# Patient Record
Sex: Female | Born: 1970 | Race: Black or African American | Hispanic: No | Marital: Single | Smoking: Never smoker
Health system: Southern US, Community
[De-identification: ages and names within clinical notes are randomized; demographics above are authoritative.]

## PROBLEM LIST (undated history)

## (undated) DIAGNOSIS — R5382 Chronic fatigue, unspecified: Secondary | ICD-10-CM

## (undated) DIAGNOSIS — Z87442 Personal history of urinary calculi: Secondary | ICD-10-CM

## (undated) DIAGNOSIS — R112 Nausea with vomiting, unspecified: Secondary | ICD-10-CM

## (undated) DIAGNOSIS — M797 Fibromyalgia: Secondary | ICD-10-CM

## (undated) DIAGNOSIS — R519 Headache, unspecified: Secondary | ICD-10-CM

## (undated) DIAGNOSIS — G5603 Carpal tunnel syndrome, bilateral upper limbs: Secondary | ICD-10-CM

## (undated) DIAGNOSIS — R200 Anesthesia of skin: Secondary | ICD-10-CM

## (undated) DIAGNOSIS — F410 Panic disorder [episodic paroxysmal anxiety] without agoraphobia: Secondary | ICD-10-CM

## (undated) DIAGNOSIS — D649 Anemia, unspecified: Secondary | ICD-10-CM

## (undated) DIAGNOSIS — M1990 Unspecified osteoarthritis, unspecified site: Secondary | ICD-10-CM

## (undated) DIAGNOSIS — R202 Paresthesia of skin: Secondary | ICD-10-CM

## (undated) DIAGNOSIS — Z9889 Other specified postprocedural states: Secondary | ICD-10-CM

## (undated) DIAGNOSIS — K219 Gastro-esophageal reflux disease without esophagitis: Secondary | ICD-10-CM

## (undated) DIAGNOSIS — R42 Dizziness and giddiness: Secondary | ICD-10-CM

## (undated) DIAGNOSIS — F32A Depression, unspecified: Secondary | ICD-10-CM

## (undated) DIAGNOSIS — F4310 Post-traumatic stress disorder, unspecified: Secondary | ICD-10-CM

## (undated) DIAGNOSIS — IMO0001 Reserved for inherently not codable concepts without codable children: Secondary | ICD-10-CM

## (undated) DIAGNOSIS — I959 Hypotension, unspecified: Secondary | ICD-10-CM

## (undated) DIAGNOSIS — R51 Headache: Secondary | ICD-10-CM

## (undated) HISTORY — PX: FOOT SURGERY: SHX648

---

## 2009-01-21 ENCOUNTER — Emergency Department (HOSPITAL_COMMUNITY): Admission: EM | Admit: 2009-01-21 | Discharge: 2009-01-21 | Payer: Self-pay | Admitting: *Deleted

## 2009-08-31 ENCOUNTER — Emergency Department (HOSPITAL_COMMUNITY): Admission: EM | Admit: 2009-08-31 | Discharge: 2009-08-31 | Payer: Self-pay | Admitting: Emergency Medicine

## 2010-11-23 ENCOUNTER — Emergency Department (HOSPITAL_COMMUNITY): Admission: EM | Admit: 2010-11-23 | Discharge: 2010-01-25 | Payer: Self-pay | Admitting: Emergency Medicine

## 2011-01-10 ENCOUNTER — Emergency Department (HOSPITAL_COMMUNITY)
Admission: EM | Admit: 2011-01-10 | Discharge: 2011-01-10 | Payer: Self-pay | Source: Home / Self Care | Admitting: Emergency Medicine

## 2011-01-10 LAB — URINALYSIS, ROUTINE W REFLEX MICROSCOPIC
Bilirubin Urine: NEGATIVE
Ketones, ur: NEGATIVE mg/dL
Nitrite: NEGATIVE
Protein, ur: NEGATIVE mg/dL
Specific Gravity, Urine: 1.006 (ref 1.005–1.030)
Urine Glucose, Fasting: NEGATIVE mg/dL
Urobilinogen, UA: 0.2 mg/dL (ref 0.0–1.0)
pH: 7.5 (ref 5.0–8.0)

## 2011-01-10 LAB — DIFFERENTIAL
Basophils Absolute: 0 K/uL (ref 0.0–0.1)
Basophils Relative: 0 % (ref 0–1)
Eosinophils Absolute: 0 K/uL (ref 0.0–0.7)
Eosinophils Relative: 1 % (ref 0–5)
Lymphocytes Relative: 49 % — ABNORMAL HIGH (ref 12–46)
Lymphs Abs: 1.7 K/uL (ref 0.7–4.0)
Monocytes Absolute: 0.3 K/uL (ref 0.1–1.0)
Monocytes Relative: 9 % (ref 3–12)
Neutro Abs: 1.5 K/uL — ABNORMAL LOW (ref 1.7–7.7)
Neutrophils Relative %: 42 % — ABNORMAL LOW (ref 43–77)

## 2011-01-10 LAB — CBC
HCT: 34.9 % — ABNORMAL LOW (ref 36.0–46.0)
Hemoglobin: 11.3 g/dL — ABNORMAL LOW (ref 12.0–15.0)
MCH: 30.1 pg (ref 26.0–34.0)
MCHC: 32.4 g/dL (ref 30.0–36.0)
MCV: 92.8 fL (ref 78.0–100.0)
Platelets: 274 K/uL (ref 150–400)
RBC: 3.76 MIL/uL — ABNORMAL LOW (ref 3.87–5.11)
RDW: 13 % (ref 11.5–15.5)
WBC: 3.5 K/uL — ABNORMAL LOW (ref 4.0–10.5)

## 2011-01-10 LAB — COMPREHENSIVE METABOLIC PANEL
BUN: 7 mg/dL (ref 6–23)
Creatinine, Ser: 0.84 mg/dL (ref 0.4–1.2)
Potassium: 4 mEq/L (ref 3.5–5.1)
Sodium: 141 mEq/L (ref 135–145)

## 2011-01-10 LAB — URINE MICROSCOPIC-ADD ON

## 2011-01-10 LAB — D-DIMER, QUANTITATIVE

## 2011-01-10 LAB — POCT PREGNANCY, URINE: Preg Test, Ur: NEGATIVE

## 2011-01-22 ENCOUNTER — Emergency Department (HOSPITAL_COMMUNITY): Payer: Medicaid Other

## 2011-01-22 ENCOUNTER — Emergency Department (HOSPITAL_COMMUNITY)
Admission: EM | Admit: 2011-01-22 | Discharge: 2011-01-22 | Disposition: A | Discharge status: Home / Self Care | Payer: Medicaid Other | Attending: Emergency Medicine | Admitting: Emergency Medicine

## 2011-01-22 DIAGNOSIS — K219 Gastro-esophageal reflux disease without esophagitis: Secondary | ICD-10-CM | POA: Insufficient documentation

## 2011-01-22 DIAGNOSIS — N898 Other specified noninflammatory disorders of vagina: Secondary | ICD-10-CM | POA: Insufficient documentation

## 2011-01-22 DIAGNOSIS — R109 Unspecified abdominal pain: Secondary | ICD-10-CM | POA: Insufficient documentation

## 2011-01-22 DIAGNOSIS — N949 Unspecified condition associated with female genital organs and menstrual cycle: Secondary | ICD-10-CM | POA: Insufficient documentation

## 2011-01-22 DIAGNOSIS — R10819 Abdominal tenderness, unspecified site: Secondary | ICD-10-CM | POA: Insufficient documentation

## 2011-01-22 DIAGNOSIS — IMO0001 Reserved for inherently not codable concepts without codable children: Secondary | ICD-10-CM | POA: Insufficient documentation

## 2011-01-22 DIAGNOSIS — M549 Dorsalgia, unspecified: Secondary | ICD-10-CM | POA: Insufficient documentation

## 2011-01-22 LAB — URINE MICROSCOPIC-ADD ON

## 2011-01-22 LAB — CBC
Hemoglobin: 11.3 g/dL — ABNORMAL LOW (ref 12.0–15.0)
MCV: 91.9 fL (ref 78.0–100.0)
WBC: 4.4 10*3/uL (ref 4.0–10.5)

## 2011-01-22 LAB — WET PREP, GENITAL
Clue Cells Wet Prep HPF POC: NONE SEEN
Yeast Wet Prep HPF POC: NONE SEEN

## 2011-01-22 LAB — COMPREHENSIVE METABOLIC PANEL
Albumin: 3.6 g/dL (ref 3.5–5.2)
CO2: 26 mEq/L (ref 19–32)
Chloride: 105 mEq/L (ref 96–112)
Creatinine, Ser: 0.88 mg/dL (ref 0.4–1.2)
GFR calc non Af Amer: >60 mL/min (ref >60)
Glucose, Bld: 85 mg/dL (ref 70–99)
Sodium: 138 mEq/L (ref 135–145)
Total Bilirubin: 0.7 mg/dL (ref 0.3–1.2)

## 2011-01-22 LAB — URINALYSIS, ROUTINE W REFLEX MICROSCOPIC
Ketones, ur: NEGATIVE mg/dL
Nitrite: NEGATIVE
Specific Gravity, Urine: 1.018 (ref 1.005–1.030)
pH: 6.5 (ref 5.0–8.0)

## 2011-01-22 LAB — DIFFERENTIAL
Basophils Absolute: 0 10*3/uL (ref 0.0–0.1)
Basophils Relative: 0 % (ref 0–1)
Eosinophils Absolute: 0 10*3/uL (ref 0.0–0.7)
Lymphs Abs: 1.8 10*3/uL (ref 0.7–4.0)
Monocytes Relative: 10 % (ref 3–12)

## 2011-01-22 LAB — PREGNANCY, URINE: Preg Test, Ur: NEGATIVE

## 2011-03-07 LAB — COMPREHENSIVE METABOLIC PANEL
Alkaline Phosphatase: 43 U/L (ref 39–117)
BUN: 10 mg/dL (ref 6–23)
CO2: 26 mEq/L (ref 19–32)
Creatinine, Ser: 0.86 mg/dL (ref 0.4–1.2)
GFR calc Af Amer: >60 mL/min (ref >60)
GFR calc non Af Amer: >60 mL/min (ref >60)
Glucose, Bld: 98 mg/dL (ref 70–99)
Potassium: 3.9 mEq/L (ref 3.5–5.1)
Sodium: 132 mEq/L — ABNORMAL LOW (ref 135–145)

## 2011-03-07 LAB — URINALYSIS, ROUTINE W REFLEX MICROSCOPIC
Bilirubin Urine: NEGATIVE
Glucose, UA: NEGATIVE mg/dL
Ketones, ur: NEGATIVE mg/dL
Leukocytes, UA: NEGATIVE
Protein, ur: NEGATIVE mg/dL
Urobilinogen, UA: 1 mg/dL (ref 0.0–1.0)

## 2011-03-07 LAB — CBC
RDW: 12.5 % (ref 11.5–15.5)
WBC: 4.9 10*3/uL (ref 4.0–10.5)

## 2011-03-07 LAB — POCT CARDIAC MARKERS
Myoglobin, poc: 25 ng/mL (ref 12–200)
Troponin i, poc: <0.05 ng/mL (ref 0.00–0.09)

## 2011-03-07 LAB — URINE MICROSCOPIC-ADD ON

## 2011-03-07 LAB — DIFFERENTIAL
Basophils Absolute: 0 10*3/uL (ref 0.0–0.1)
Eosinophils Relative: 2 % (ref 0–5)
Lymphocytes Relative: 53 % — ABNORMAL HIGH (ref 12–46)
Lymphs Abs: 2.6 10*3/uL (ref 0.7–4.0)
Monocytes Relative: 7 % (ref 3–12)
Neutrophils Relative %: 38 % — ABNORMAL LOW (ref 43–77)

## 2011-03-23 LAB — URINALYSIS, ROUTINE W REFLEX MICROSCOPIC
Leukocytes, UA: NEGATIVE
Nitrite: NEGATIVE
Protein, ur: NEGATIVE mg/dL
pH: 5.5 (ref 5.0–8.0)

## 2011-03-23 LAB — CBC
Hemoglobin: 12.1 g/dL (ref 12.0–15.0)
MCHC: 33.9 g/dL (ref 30.0–36.0)
MCV: 93.3 fL (ref 78.0–100.0)
RBC: 3.83 MIL/uL — ABNORMAL LOW (ref 3.87–5.11)

## 2011-03-23 LAB — POCT I-STAT, CHEM 8
BUN: 10 mg/dL (ref 6–23)
Calcium, Ion: 1.03 mmol/L — ABNORMAL LOW (ref 1.12–1.32)
Creatinine, Ser: 0.8 mg/dL (ref 0.4–1.2)
Glucose, Bld: 86 mg/dL (ref 70–99)
TCO2: 24 mmol/L (ref 0–100)

## 2011-03-23 LAB — DIFFERENTIAL
Basophils Relative: 0 % (ref 0–1)
Eosinophils Absolute: 0 10*3/uL (ref 0.0–0.7)
Lymphs Abs: 1.6 10*3/uL (ref 0.7–4.0)
Monocytes Absolute: 0.3 10*3/uL (ref 0.1–1.0)
Neutro Abs: 1.7 10*3/uL (ref 1.7–7.7)

## 2011-03-23 LAB — URINE MICROSCOPIC-ADD ON

## 2011-09-12 ENCOUNTER — Emergency Department (INDEPENDENT_AMBULATORY_CARE_PROVIDER_SITE_OTHER): Payer: Medicaid Other

## 2011-09-12 ENCOUNTER — Emergency Department (HOSPITAL_BASED_OUTPATIENT_CLINIC_OR_DEPARTMENT_OTHER)
Admission: EM | Admit: 2011-09-12 | Discharge: 2011-09-13 | Disposition: A | Discharge status: Home / Self Care | Payer: Medicaid Other | Attending: Emergency Medicine | Admitting: Emergency Medicine

## 2011-09-12 ENCOUNTER — Encounter: Payer: Self-pay | Admitting: *Deleted

## 2011-09-12 DIAGNOSIS — X500XXA Overexertion from strenuous movement or load, initial encounter: Secondary | ICD-10-CM

## 2011-09-12 DIAGNOSIS — R209 Unspecified disturbances of skin sensation: Secondary | ICD-10-CM

## 2011-09-12 DIAGNOSIS — IMO0002 Reserved for concepts with insufficient information to code with codable children: Secondary | ICD-10-CM | POA: Insufficient documentation

## 2011-09-12 DIAGNOSIS — IMO0001 Reserved for inherently not codable concepts without codable children: Secondary | ICD-10-CM | POA: Insufficient documentation

## 2011-09-12 DIAGNOSIS — Z8739 Personal history of other diseases of the musculoskeletal system and connective tissue: Secondary | ICD-10-CM | POA: Insufficient documentation

## 2011-09-12 DIAGNOSIS — S8391XA Sprain of unspecified site of right knee, initial encounter: Secondary | ICD-10-CM

## 2011-09-12 DIAGNOSIS — M25569 Pain in unspecified knee: Secondary | ICD-10-CM

## 2011-09-12 HISTORY — DX: Fibromyalgia: M79.7

## 2011-09-12 HISTORY — DX: Anemia, unspecified: D64.9

## 2011-09-12 HISTORY — DX: Unspecified osteoarthritis, unspecified site: M19.90

## 2011-09-12 MED ORDER — OXYCODONE-ACETAMINOPHEN 5-325 MG PO TABS
1.0000 | ORAL_TABLET | Freq: Once | ORAL | Status: AC
  Administered 2011-09-12: 1 via ORAL
  Filled 2011-09-12: qty 1

## 2011-09-12 MED ORDER — IBUPROFEN 400 MG PO TABS
400.0000 mg | ORAL_TABLET | Freq: Once | ORAL | Status: AC
  Administered 2011-09-12: 400 mg via ORAL
  Filled 2011-09-12: qty 1

## 2011-09-12 NOTE — ED Notes (Signed)
Has had an URI the past few days. Now right knee pain and swelling.

## 2011-09-12 NOTE — ED Provider Notes (Signed)
History     CSN: [id number] Arrival date & time: 09/12/2011 10:42 PM  Chief Complaint  Patient presents with  . Knee Pain    (Consider location/radiation/quality/duration/timing/severity/associated sxs/prior treatment) HPI Comments: Patient presents here to the emergency department due to increasing pain in her right knee. She denies any specific falls. Patient reports that she has had some gradual worsening right knee pain throughout the day which he attributed to poor footwear. She reports that she was walking on level ground and does recall turning or twisting possibly on her knee and subsequently had sharp pain during the day and she reports the knee gave way and she actually fell to the ground. Denies any significant injuries from the fall. She has been able to bear weight but not for a well. She reports pain when she tries to flex her knee greater than 30. She reports the pain radiates down the back of her knee along her lower leg all the way to her ankle. She denies any numbness or weakness to her right leg. Patient also reports for the past 5 days she's had some myalgias, coughing, fatigue and weakness. She does have a significant history of fibromyalgia. She was seen by her primary care doctor for her other symptoms but did not have any pain at the time. She was supposed to have blood tests and a followup with her primary care physician. She reports no significant swelling to her lower extremity, chest pain, pleuritic pain subsequent to her lower leg injury. She denies any recent long distance travel.  Patient is a 40 y.o. female presenting with knee pain. The history is provided by the patient.  Knee Pain Associated symptoms include chest pain and shortness of breath. Pertinent negatives include no headaches.    Past Medical History  Diagnosis Date  . Fibromyalgia   . Arthritis   . Anemia     Past Surgical History  Procedure Date  . Cesarean section   . Foot surgery     No  family history on file.  History  Substance Use Topics  . Smoking status: Never Smoker   . Smokeless tobacco: Not on file  . Alcohol Use: No    OB History    Grav Para Term Preterm Abortions TAB SAB Ect Mult Living                  Review of Systems  Constitutional: Positive for chills. Negative for fever, activity change and appetite change.  Respiratory: Positive for cough and shortness of breath. Negative for chest tightness and wheezing.   Cardiovascular: Positive for chest pain. Negative for leg swelling.  Musculoskeletal: Positive for myalgias and arthralgias. Negative for back pain, joint swelling and gait problem.  Neurological: Negative for syncope and headaches.    Allergies  Other  Home Medications   Current Outpatient Rx  Name Route Sig Dispense Refill  . NAPROXEN SODIUM 220 MG PO TABS Oral Take 440 mg by mouth 2 (two) times daily with a meal.      . COLD AND FLU PO Oral Take 2 tablets by mouth once.        BP 108/69  Pulse 75  Temp(Src) 99.6 F (37.6 C) (Oral)  Resp 20  SpO2 100%  Physical Exam  Constitutional: She appears well-developed and well-nourished. She appears distressed.  Cardiovascular: Normal rate and regular rhythm.   Pulmonary/Chest: Effort normal and breath sounds normal.  Musculoskeletal:       Legs:  Normal distal strength to RLE, gross sensation is intact, DP is 2+  Neurological: She has normal strength. No sensory deficit.  Skin: Skin is warm and dry. No abrasion, no bruising, no laceration, no lesion, no petechiae and no rash noted.    ED Course  Procedures (including critical care time)  Labs Reviewed - No data to display No results found.   No diagnosis found.    MDM  No risks for DVT or PE.  Pt likely injured it when she twisted.  I favor a meniscal injury or other mild ligamentous strain or possible rupture.  Will get plain films, place in knee immobilizer, crutches.  Pt and family advise about RICE therapy and  follow up with sports physician or orthopedist.        12:19 AM I reviewed plain films of right knee and is negative for fractures    Gavin Pound. Oletta Lamas, MD 09/13/11 1610

## 2011-09-12 NOTE — ED Notes (Signed)
Pt. Has positive pedal pulses bilat. And is in no resp. Distress.

## 2011-09-12 NOTE — ED Notes (Signed)
Pt. Reports she has had flu like symptoms and has seen her primary MD for this today.  Pt. Skin is warm and dry.

## 2011-09-13 ENCOUNTER — Encounter (HOSPITAL_BASED_OUTPATIENT_CLINIC_OR_DEPARTMENT_OTHER): Payer: Self-pay | Admitting: Emergency Medicine

## 2011-09-13 MED ORDER — OXYCODONE-ACETAMINOPHEN 5-325 MG PO TABS: ORAL_TABLET | ORAL | Status: DC

## 2011-09-13 NOTE — Discharge Instructions (Signed)
Narcotic and benzodiazepine use may cause drowsiness, slowed breathing or dependence.  Please use with caution and do not drive, operate machinery or watch young children alone while taking them.  Taking combinations of these medications or drinking alcohol will potentiate these effects.    

## 2011-09-13 NOTE — ED Notes (Signed)
Pt. Walks on crutches with no trouble

## 2011-09-17 ENCOUNTER — Encounter: Payer: Self-pay | Admitting: Family Medicine

## 2011-09-17 ENCOUNTER — Ambulatory Visit (INDEPENDENT_AMBULATORY_CARE_PROVIDER_SITE_OTHER): Payer: Medicaid Other | Admitting: Family Medicine

## 2011-09-17 VITALS — BP 98/68 | HR 71 | Temp 98.1°F | Ht 66.0 in | Wt 160.0 lb

## 2011-09-17 DIAGNOSIS — M25561 Pain in right knee: Secondary | ICD-10-CM

## 2011-09-17 DIAGNOSIS — S99929A Unspecified injury of unspecified foot, initial encounter: Secondary | ICD-10-CM

## 2011-09-17 DIAGNOSIS — M25569 Pain in unspecified knee: Secondary | ICD-10-CM

## 2011-09-17 DIAGNOSIS — S8991XA Unspecified injury of right lower leg, initial encounter: Secondary | ICD-10-CM

## 2011-09-17 MED ORDER — ETODOLAC 400 MG PO TABS: 400.0000 mg | ORAL_TABLET | Freq: Two times a day (BID) | ORAL | Status: AC

## 2011-09-17 NOTE — Progress Notes (Signed)
  Subjective:    Patient ID: Stefanie Wade, female    DOB: Sep 10, 1971, 40 y.o.   MRN: [id number]  PCP: Dr. August Luz  HPI 40 yo F here for right knee injury  Patient reports on 9/26 she was standing in the kitchen, turned to go to the left and felt her right knee almost give out with medial knee pain. Reports she could barely move and knee was very swollen. Went to ED and was given immobilizer with crutches, percocet after x-rays were negative. No prior right knee injuries or surgeries. Not taking percocet or motrin now. Etodolac helps - was on this before for other issues. No true locking and hasn't given out, just feels unstable.  Past Medical History  Diagnosis Date  . Fibromyalgia   . Arthritis   . Anemia     Current Outpatient Prescriptions on File Prior to Visit  Medication Sig Dispense Refill  . Nutritional Supplements (COLD AND FLU PO) Take 2 tablets by mouth once.          Past Surgical History  Procedure Date  . Cesarean section   . Foot surgery     Allergies  Allergen Reactions  . Other     Tylenol #3 becomes jittery when taking  . Percocet (Oxycodone-Acetaminophen)     Upsets stomach    History   Social History  . Marital Status: Divorced    Spouse Name: N/A    Number of Children: N/A  . Years of Education: N/A   Occupational History  . Not on file.   Social History Main Topics  . Smoking status: Never Smoker   . Smokeless tobacco: Not on file  . Alcohol Use: No  . Drug Use: Not on file  . Sexually Active: Not on file   Other Topics Concern  . Not on file   Social History Narrative  . No narrative on file    Family History  Problem Relation Age of Onset  . Diabetes Mother   . Hypertension Mother   . Diabetes Sister   . Diabetes Maternal Grandmother   . Hypertension Maternal Grandmother   . Diabetes Paternal Grandmother   . Hypertension Paternal Grandmother   . Heart attack Neg Hx   . Hyperlipidemia Neg Hx   . Sudden death Neg Hx      BP 98/68  Pulse 71  Temp(Src) 98.1 F (36.7 C) (Oral)  Ht 5\' 6"  (1.676 m)  Wt 160 lb (72.576 kg)  BMI 25.82 kg/m2  Review of Systems See HPI above.    Objective:   Physical Exam Gen: NAD  R knee: No gross deformity, ecchymoses, swelling. Mod medial joint line TTP.  Mild lateral joint line TTP.  Diffuse posterior and anterior tenderness, mild. FROM. Negative ant/post drawers. Negative valgus/varus testing. Negative lachmanns. Pain anteriorly with mcmurrays and apleys.  Negative patellar apprehension. NV intact distally.    Assessment & Plan:  1. Right knee injury - X-rays negative.  Ligaments intact.  Most tenderness is at medial joint line though exam shows tenderness throughout entire knee.  Most likely 2/2 meniscal contusion though meniscal tear is possible.  Start PT, icing, etodolac.  Could consider cortisone injection as well.  If not improving after 5-6 weeks of conservative care, would proceed with MRI to further assess.

## 2011-09-17 NOTE — Patient Instructions (Signed)
Your history and exam are suggestive of either a meniscus contusion (bruise) or a small meniscal tear. Both are treated conservatively initially. Take etodolac daily with food. Ice knee 15 minutes at a time 3-4 times a day. Don't use the immobilizer. Ok to use crutches if you need to. Start physical therapy to focus on quad strengthening and transition to a home exercise program. Follow up with me in 5 weeks when you are 6 weeks out from your injury. If not improving at that time, we will move forward with an MRI of your knee.

## 2011-09-17 NOTE — Assessment & Plan Note (Signed)
X-rays negative.  Ligaments intact.  Most tenderness is at medial joint line though exam shows tenderness throughout entire knee.  Most likely 2/2 meniscal contusion though meniscal tear is possible.  Start PT, icing, etodolac.  Could consider cortisone injection as well.  If not improving after 5-6 weeks of conservative care, would proceed with MRI to further assess.

## 2011-09-26 ENCOUNTER — Ambulatory Visit: Payer: Medicaid Other | Attending: Family Medicine | Admitting: Physical Therapy

## 2011-10-22 ENCOUNTER — Ambulatory Visit (INDEPENDENT_AMBULATORY_CARE_PROVIDER_SITE_OTHER): Payer: Medicaid Other | Admitting: Family Medicine

## 2011-10-22 ENCOUNTER — Encounter: Payer: Self-pay | Admitting: Family Medicine

## 2011-10-22 VITALS — BP 99/66 | HR 81 | Temp 98.6°F | Ht 66.0 in | Wt 155.0 lb

## 2011-10-22 DIAGNOSIS — S8991XA Unspecified injury of right lower leg, initial encounter: Secondary | ICD-10-CM

## 2011-10-22 DIAGNOSIS — S99929A Unspecified injury of unspecified foot, initial encounter: Secondary | ICD-10-CM

## 2011-10-22 NOTE — Patient Instructions (Signed)
Start physical therapy for at minimum 1 visit then transition to a home exercise program and do this daily. Take etodolac twice a day with food. Ice knee 15 minutes at a time 3-4 times a day as needed for pain. Ok to consider ACE wrap of the knee for stability but don't use the immobilizer. Follow up with me in 6 weeks for a recheck. If not improving at that time, we will move forward with an MRI of your knee. If at any time over the next 6 weeks you want to go ahead with a cortisone injection we could do this as well.

## 2011-10-22 NOTE — Assessment & Plan Note (Signed)
X-rays negative.  Concerning for possible meniscal tear though diffuse tenderness and no effusion may be nonanatomic as well.  Will try conservative care.  She has not done PT or home exercise program - checked with her insurance and copay and she would have to pay minimal amount and would be covered for 3 visits - she stated she will do this.  Declined an intraarticular cortisone injection today.  Continue icing, etodolac (though advised to take bid regularly).  F/u in 6 weeks for reevaluation.

## 2011-10-22 NOTE — Progress Notes (Signed)
Subjective:    Patient ID: Stefanie Wade, female    DOB: 08-10-1971, 40 y.o.   MRN: [id number]  PCP: Dr. August Luz  HPI  40 yo F here for f/u right knee injury  10/1: Patient reports on 9/26 she was standing in the kitchen, turned to go to the left and felt her right knee almost give out with medial knee pain. Reports she could barely move and knee was very swollen. Went to ED and was given immobilizer with crutches, percocet after x-rays were negative. No prior right knee injuries or surgeries. Not taking percocet or motrin now. Etodolac helps - was on this before for other issues. No true locking and hasn't given out, just feels unstable.  11/5: Patient reports she did not go to physical therapy because she thought she would have to pay a lot of money for visits. Has been taking etodolac occasionally. Not using crutches and has stopped using her immobilizer. No new swelling or bruising. No new injury. Reports pain is anterior mostly but has started to get pain into calf, across medial knee and up posterior thigh. Has history of low back pain regularly that preceded her injury. No new numbness/tingling. States knee feels like it's giving out.  No locking.  Past Medical History  Diagnosis Date  . Fibromyalgia   . Arthritis   . Anemia     Current Outpatient Prescriptions on File Prior to Visit  Medication Sig Dispense Refill  . etodolac (LODINE) 400 MG tablet Take 1 tablet (400 mg total) by mouth 2 (two) times daily.  60 tablet  1  . Nutritional Supplements (COLD AND FLU PO) Take 2 tablets by mouth once.          Past Surgical History  Procedure Date  . Cesarean section   . Foot surgery     Allergies  Allergen Reactions  . Other     Tylenol #3 becomes jittery when taking  . Percocet (Oxycodone-Acetaminophen)     Upsets stomach    History   Social History  . Marital Status: Divorced    Spouse Name: N/A    Number of Children: N/A  . Years of Education: N/A     Occupational History  . Not on file.   Social History Main Topics  . Smoking status: Never Smoker   . Smokeless tobacco: Not on file  . Alcohol Use: No  . Drug Use: Not on file  . Sexually Active: Not on file   Other Topics Concern  . Not on file   Social History Narrative  . No narrative on file    Family History  Problem Relation Age of Onset  . Diabetes Mother   . Hypertension Mother   . Diabetes Sister   . Diabetes Maternal Grandmother   . Hypertension Maternal Grandmother   . Diabetes Paternal Grandmother   . Hypertension Paternal Grandmother   . Heart attack Neg Hx   . Hyperlipidemia Neg Hx   . Sudden death Neg Hx     BP 99/66  Pulse 81  Temp(Src) 98.6 F (37 C) (Oral)  Ht 5\' 6"  (1.676 m)  Wt 155 lb (70.308 kg)  BMI 25.02 kg/m2  Review of Systems  See HPI above.    Objective:   Physical Exam  Gen: NAD  R knee: No gross deformity, ecchymoses, swelling.  No palpable cords. Mod medial joint line TTP.  Mild lateral joint line TTP.  Diffuse posterior and anterior tenderness, mild - not all pain anatomic  including some pain post patellar facets, anterior patella and patellar tendon. FROM. Negative ant/post drawers. Negative valgus/varus testing. Negative lachmanns. Pain anteriorly with mcmurrays and apleys.  Negative patellar apprehension. NV intact distally.    Assessment & Plan:  1. Right knee injury - X-rays negative.  Concerning for possible meniscal tear though diffuse tenderness and no effusion may be nonanatomic as well.  Will try conservative care.  She has not done PT or home exercise program - checked with her insurance and copay and she would have to pay minimal amount and would be covered for 3 visits - she stated she will do this.  Declined an intraarticular cortisone injection today.  Continue icing, etodolac (though advised to take bid regularly).  F/u in 6 weeks for reevaluation.

## 2011-10-24 ENCOUNTER — Ambulatory Visit: Payer: Medicaid Other | Attending: Family Medicine | Admitting: Physical Therapy

## 2011-10-24 DIAGNOSIS — IMO0001 Reserved for inherently not codable concepts without codable children: Secondary | ICD-10-CM | POA: Insufficient documentation

## 2011-10-24 DIAGNOSIS — M25669 Stiffness of unspecified knee, not elsewhere classified: Secondary | ICD-10-CM | POA: Insufficient documentation

## 2011-10-24 DIAGNOSIS — M25569 Pain in unspecified knee: Secondary | ICD-10-CM | POA: Insufficient documentation

## 2011-10-29 ENCOUNTER — Ambulatory Visit: Payer: Medicaid Other | Admitting: Physical Therapy

## 2011-10-31 MED ORDER — TRAMADOL HCL 50 MG PO TABS: 50.0000 mg | ORAL_TABLET | Freq: Three times a day (TID) | ORAL | Status: AC | PRN

## 2011-10-31 NOTE — Progress Notes (Signed)
Addended by: Lenda Kelp on: 10/31/2011 10:44 AM   Modules accepted: Orders

## 2011-11-12 ENCOUNTER — Encounter: Payer: Medicaid Other | Admitting: Physical Therapy

## 2011-11-14 ENCOUNTER — Ambulatory Visit: Payer: Medicaid Other | Admitting: Physical Therapy

## 2011-11-19 ENCOUNTER — Ambulatory Visit: Payer: Medicaid Other | Attending: Family Medicine | Admitting: Physical Therapy

## 2011-11-19 DIAGNOSIS — M25569 Pain in unspecified knee: Secondary | ICD-10-CM | POA: Insufficient documentation

## 2011-11-19 DIAGNOSIS — IMO0001 Reserved for inherently not codable concepts without codable children: Secondary | ICD-10-CM | POA: Insufficient documentation

## 2011-11-19 DIAGNOSIS — M25669 Stiffness of unspecified knee, not elsewhere classified: Secondary | ICD-10-CM | POA: Insufficient documentation

## 2011-12-03 ENCOUNTER — Ambulatory Visit: Payer: Medicaid Other | Admitting: Family Medicine

## 2011-12-12 ENCOUNTER — Ambulatory Visit: Payer: Medicaid Other | Admitting: Family Medicine

## 2012-06-22 ENCOUNTER — Other Ambulatory Visit: Payer: Self-pay

## 2012-06-22 ENCOUNTER — Encounter (HOSPITAL_BASED_OUTPATIENT_CLINIC_OR_DEPARTMENT_OTHER): Payer: Self-pay | Admitting: *Deleted

## 2012-06-22 ENCOUNTER — Emergency Department (HOSPITAL_BASED_OUTPATIENT_CLINIC_OR_DEPARTMENT_OTHER): Payer: Medicaid Other

## 2012-06-22 ENCOUNTER — Emergency Department (HOSPITAL_BASED_OUTPATIENT_CLINIC_OR_DEPARTMENT_OTHER)
Admission: EM | Admit: 2012-06-22 | Discharge: 2012-06-22 | Disposition: A | Discharge status: Home / Self Care | Payer: Medicaid Other | Attending: Emergency Medicine | Admitting: Emergency Medicine

## 2012-06-22 DIAGNOSIS — R5381 Other malaise: Secondary | ICD-10-CM | POA: Insufficient documentation

## 2012-06-22 DIAGNOSIS — M549 Dorsalgia, unspecified: Secondary | ICD-10-CM | POA: Insufficient documentation

## 2012-06-22 DIAGNOSIS — R110 Nausea: Secondary | ICD-10-CM | POA: Insufficient documentation

## 2012-06-22 DIAGNOSIS — R5383 Other fatigue: Secondary | ICD-10-CM | POA: Insufficient documentation

## 2012-06-22 DIAGNOSIS — Z79899 Other long term (current) drug therapy: Secondary | ICD-10-CM | POA: Insufficient documentation

## 2012-06-22 DIAGNOSIS — M79605 Pain in left leg: Secondary | ICD-10-CM

## 2012-06-22 DIAGNOSIS — R079 Chest pain, unspecified: Secondary | ICD-10-CM | POA: Insufficient documentation

## 2012-06-22 DIAGNOSIS — R109 Unspecified abdominal pain: Secondary | ICD-10-CM | POA: Insufficient documentation

## 2012-06-22 DIAGNOSIS — R42 Dizziness and giddiness: Secondary | ICD-10-CM | POA: Insufficient documentation

## 2012-06-22 DIAGNOSIS — M79609 Pain in unspecified limb: Secondary | ICD-10-CM | POA: Insufficient documentation

## 2012-06-22 DIAGNOSIS — N898 Other specified noninflammatory disorders of vagina: Secondary | ICD-10-CM | POA: Insufficient documentation

## 2012-06-22 LAB — COMPREHENSIVE METABOLIC PANEL
ALT: 13 U/L (ref 0–35)
AST: 20 U/L (ref 0–37)
CO2: 25 mEq/L (ref 19–32)
Chloride: 102 mEq/L (ref 96–112)
Creatinine, Ser: 1 mg/dL (ref 0.50–1.10)
GFR calc non Af Amer: 70 mL/min — ABNORMAL LOW (ref >90)
Total Bilirubin: 0.5 mg/dL (ref 0.3–1.2)

## 2012-06-22 LAB — URINALYSIS, ROUTINE W REFLEX MICROSCOPIC
Glucose, UA: NEGATIVE mg/dL
Ketones, ur: NEGATIVE mg/dL
pH: 6 (ref 5.0–8.0)

## 2012-06-22 LAB — CBC WITH DIFFERENTIAL/PLATELET
Basophils Absolute: 0 10*3/uL (ref 0.0–0.1)
Basophils Relative: 0 % (ref 0–1)
Eosinophils Absolute: 0 10*3/uL (ref 0.0–0.7)
MCH: 30.4 pg (ref 26.0–34.0)
MCHC: 33.2 g/dL (ref 30.0–36.0)
Neutrophils Relative %: 41 % — ABNORMAL LOW (ref 43–77)
Platelets: 288 10*3/uL (ref 150–400)
RBC: 3.78 MIL/uL — ABNORMAL LOW (ref 3.87–5.11)
RDW: 12.7 % (ref 11.5–15.5)

## 2012-06-22 LAB — D-DIMER, QUANTITATIVE: D-Dimer, Quant: <0.22 ug/mL-FEU (ref 0.00–0.48)

## 2012-06-22 LAB — URINE MICROSCOPIC-ADD ON

## 2012-06-22 MED ORDER — TRAMADOL HCL 50 MG PO TABS
50.0000 mg | ORAL_TABLET | Freq: Once | ORAL | Status: AC
  Administered 2012-06-22: 50 mg via ORAL
  Filled 2012-06-22: qty 1

## 2012-06-22 MED ORDER — SODIUM CHLORIDE 0.9 % IV BOLUS (SEPSIS)
1000.0000 mL | Freq: Once | INTRAVENOUS | Status: AC
  Administered 2012-06-22: 1000 mL via INTRAVENOUS

## 2012-06-22 MED ORDER — TRAMADOL HCL 50 MG PO TABS: ORAL_TABLET | ORAL | Status: DC

## 2012-06-22 NOTE — ED Notes (Signed)
Pt states that she started having lower abd pain yesterday with nausea. Pt now c/o leg swelling, thigh pain, and some chest discomfort.

## 2012-06-22 NOTE — ED Provider Notes (Addendum)
History  This chart was scribed for Cyndra Numbers, MD by Erskine Emery. This patient was seen in room MH11/MH11 and the patient's care was started at 19:26.  CSN: [id number]  Arrival date & time 06/22/12  1913   First MD Initiated Contact with Patient 06/22/12 1926      Chief Complaint  Patient presents with  . Abdominal Pain  . Leg Pain    (Consider location/radiation/quality/duration/timing/severity/associated sxs/prior treatment) HPI  Stefanie Wade is a 41 y.o. female who presents to the Emergency Department complaining of intermittent mild chest pain lasting 1 minute at a time, described as shooting and grabbing an with associated lightheadedness, dizziness, fatigue for the past 2-3 days. Pt also reports some sharp abdominal pain, back pain, throat soreness, and a swelling and pain in her legs, described as "sticking pins". Pt denies any current nausea but states that she was nauseous yesterday. Pt describes her dizziness as the room spinning and light-headedness when she stands up. Patient states that her symptoms are worsening. Pt also denies any dysuria or hematuria, and denies any abnormal vaginal discharge, though she reports her vaginal discharge is similar to a previous episode, possibly BV that went away with medication. Pt denies taking any pain medication PTA and reports that she was able to drive herself to the ED. Pt reports she did work through the recent holiday but she has been getting plenty of rest lately. Pt reports a h/o anemia when she was little but has never had a blood transfusion. Pt reports she sometimes has issues urinating but not currently. Pt reports her baseline pain for her h/o fibromyalgia is 5/10, but denies having any problems with her fibromyalgia currently. Pt denies any h/o HTN, DM, kidney problems, liver problems, or heart issues but reports a family history of DM, MI, and thrombosis (not under the age of 18). Pt reports she was previously given anxiety  medication for irregular heart beat and panic attacks. Pt reports taking tramadol before with a reasonable response. Pt reports she typically eats a lot of shelled sunflower seeds, especially with soda to relieve nausea. Patient denies smoking.   Dr. Mikeal Hawthorne is the pt's PCP.    Past Medical History  Diagnosis Date  . Fibromyalgia   . Arthritis   . Anemia     Past Surgical History  Procedure Date  . Cesarean section   . Foot surgery     Family History  Problem Relation Age of Onset  . Diabetes Mother   . Hypertension Mother   . Diabetes Sister   . Diabetes Maternal Grandmother   . Hypertension Maternal Grandmother   . Diabetes Paternal Grandmother   . Hypertension Paternal Grandmother   . Heart attack Neg Hx   . Hyperlipidemia Neg Hx   . Sudden death Neg Hx     History  Substance Use Topics  . Smoking status: Never Smoker   . Smokeless tobacco: Not on file  . Alcohol Use: No    OB History    Grav Para Term Preterm Abortions TAB SAB Ect Mult Living                  Review of Systems  Constitutional: Positive for fatigue.  HENT: Negative.   Eyes: Negative.   Respiratory: Negative.   Cardiovascular: Positive for chest pain.  Gastrointestinal: Positive for nausea.  Genitourinary: Negative.   Musculoskeletal:       Bilateral leg pain  Skin: Negative.   Neurological: Positive for dizziness and light-headedness.  Hematological: Negative.   Psychiatric/Behavioral: Negative.   All other systems reviewed and are negative.   A complete 10 system review of systems was obtained and all systems are negative except as noted in the HPI and PMH.   Allergies  Other and Percocet  Home Medications   Current Outpatient Rx  Name Route Sig Dispense Refill  . GABAPENTIN 300 MG PO CAPS Oral Take 300 mg by mouth 3 (three) times daily. Pt states that she only takes when she feels pain    . ETODOLAC 400 MG PO TABS Oral Take 1 tablet (400 mg total) by mouth 2 (two) times  daily. 60 tablet 1  . COLD AND FLU PO Oral Take 2 tablets by mouth once.      . TRAMADOL HCL 50 MG PO TABS Oral Take 1 tablet (50 mg total) by mouth every 8 (eight) hours as needed for pain. Maximum dose= 8 tablets per day 90 tablet 0    Triage Vitals: BP 124/74  Pulse 74  Temp 98.8 F (37.1 C) (Oral)  Resp 18  Ht 5\' 6"  (1.676 m)  Wt 163 lb (73.936 kg)  BMI 26.31 kg/m2  SpO2 100%  LMP 05/31/2012  Physical Exam  Nursing note and vitals reviewed. GEN: Well-developed, well-nourished female in no distress HEENT: Atraumatic, normocephalic. Oropharynx clear without erythema EYES: PERRLA BL, no scleral icterus. NECK: Trachea midline, no meningismus CV: regular rate and rhythm. No murmurs, rubs, or gallops PULM: No respiratory distress.  No crackles, wheezes, or rales. GI: soft, non-tender. No guarding, rebound, or tenderness. + bowel sounds  GU: deferred Neuro: cranial nerves grossly 2-12 intact, no abnormalities of strength or sensation, A and O x 3, no abnormality of gait. No pronator drift, no dysmetria on finger to nose task bilaterally. MSK: Patient moves all 4 extremities symmetrically, no deformity, edema, or injury noted. Patient has tenderness to palpation of both lower extremities from the knees distally. She has absolutely no edema on my evaluation or reports that her legs are very swollen compared to previous. Both extremities are warm and well perfused. Skin: No rashes petechiae, purpura, or jaundice Psych: no abnormality of mood   ED Course  Procedures (including critical care time)  Indication: dizziness Please note this EKG was reviewed extemporaneously by myself.   Date: 06/22/2012  Rate: 72  Rhythm: normal sinus rhythm  QRS Axis: normal  Intervals: normal  ST/T Wave abnormalities: normal  Conduction Disutrbances: none  Narrative Interpretation: unremarkable        DIAGNOSTIC STUDIES: Oxygen Saturation is 100% on room air, normal by my interpretation.      COORDINATION OF CARE:  19:30--I discussed treatment plan including blood work, urinalysis, EKG, IV fluids, pain medication, chest x-ray with pt and pt agreed. I informed the pt that her neurologic exam looks good. I informed pt to follow up with her PCP for any unresolved symptoms.   19:45--Medication order: sodium chloride 0.9% bolus 1,000 mL--once  20:00--Medication order: tramadol (Ultram) tablet 50 mg--once  21:20--Rechecked pt, informed pt of negative imaging and lab results. Discussed plan for discharge and pt was agreeable.  Results for orders placed during the hospital encounter of 06/22/12  PREGNANCY, URINE      Component Value Range   Preg Test, Ur NEGATIVE  NEGATIVE  URINALYSIS, ROUTINE W REFLEX MICROSCOPIC      Component Value Range   Color, Urine YELLOW  YELLOW   APPearance CLOUDY (*) CLEAR   Specific Gravity, Urine 1.014  1.005 - 1.030  pH 6.0  5.0 - 8.0   Glucose, UA NEGATIVE  NEGATIVE mg/dL   Hgb urine dipstick MODERATE (*) NEGATIVE   Bilirubin Urine NEGATIVE  NEGATIVE   Ketones, ur NEGATIVE  NEGATIVE mg/dL   Protein, ur NEGATIVE  NEGATIVE mg/dL   Urobilinogen, UA 0.2  0.0 - 1.0 mg/dL   Nitrite NEGATIVE  NEGATIVE   Leukocytes, UA SMALL (*) NEGATIVE  COMPREHENSIVE METABOLIC PANEL      Component Value Range   Sodium 136  135 - 145 mEq/L   Potassium 4.0  3.5 - 5.1 mEq/L   Chloride 102  96 - 112 mEq/L   CO2 25  19 - 32 mEq/L   Glucose, Bld 97  70 - 99 mg/dL   BUN 14  6 - 23 mg/dL   Creatinine, Ser 6.57  0.50 - 1.10 mg/dL   Calcium 9.2  8.4 - 84.6 mg/dL   Total Protein 7.4  6.0 - 8.3 g/dL   Albumin 4.0  3.5 - 5.2 g/dL   AST 20  0 - 37 U/L   ALT 13  0 - 35 U/L   Alkaline Phosphatase 53  39 - 117 U/L   Total Bilirubin 0.5  0.3 - 1.2 mg/dL   GFR calc non Af Amer 70 (*) >90 mL/min   GFR calc Af Amer 81 (*) >90 mL/min  CBC WITH DIFFERENTIAL      Component Value Range   WBC 5.4  4.0 - 10.5 K/uL   RBC 3.78 (*) 3.87 - 5.11 MIL/uL   Hemoglobin 11.5 (*) 12.0 -  15.0 g/dL   HCT 96.2 (*) 95.2 - 84.1 %   MCV 91.5  78.0 - 100.0 fL   MCH 30.4  26.0 - 34.0 pg   MCHC 33.2  30.0 - 36.0 g/dL   RDW 32.4  40.1 - 02.7 %   Platelets 288  150 - 400 K/uL   Neutrophils Relative 41 (*) 43 - 77 %   Neutro Abs 2.2  1.7 - 7.7 K/uL   Lymphocytes Relative 49 (*) 12 - 46 %   Lymphs Abs 2.6  0.7 - 4.0 K/uL   Monocytes Relative 10  3 - 12 %   Monocytes Absolute 0.5  0.1 - 1.0 K/uL   Eosinophils Relative 1  0 - 5 %   Eosinophils Absolute 0.0  0.0 - 0.7 K/uL   Basophils Relative 0  0 - 1 %   Basophils Absolute 0.0  0.0 - 0.1 K/uL  D-DIMER, QUANTITATIVE      Component Value Range   D-Dimer, Quant <0.22  0.00 - 0.48 ug/mL-FEU  URINE MICROSCOPIC-ADD ON      Component Value Range   Squamous Epithelial / LPF MANY (*) RARE   WBC, UA 3-6  <3 WBC/hpf   RBC / HPF 3-6  <3 RBC/hpf   Bacteria, UA MANY (*) RARE     Dg Chest 2 View  06/22/2012  *RADIOLOGY REPORT*  Clinical Data: Chest pain  CHEST - 2 VIEW  Comparison: 01/10/2011  Findings: Normal heart size.  Clear lungs.  No pneumothorax or pleural effusion.  IMPRESSION: No active cardiopulmonary disease.  Original Report Authenticated By: Donavan Burnet, M.D.     1. Leg pain, bilateral   2. Chest pain   3. Light headedness       MDM  Patient was evaluated by myself. Based on evaluation she had workup for possible causes of fluid overload that she had no detectable significant edema. Patient also complained  of chest pain but has no history of hypertension, hyperlipidemia, diabetes, tobacco use, early family history of this, thromboembolic disease, or significant risk factors for thromboembolic disease. Workup was completely negative with normal EKG, chest x-ray, CBC, complete metabolic panel, d-dimer, urinalysis, urine pregnancy. On further discussion patient appeared to have more of a lightheadedness than dizziness. She had a completely normal neurologic exam. Patient was given a total of 100 mg of tramadol by mouth for  her pain here in the emergency department. She reported that she had gotten anxious when treated with oral narcotic medications previously and therefore nothing stronger was provided. Patient no history of injury. She had no skin changes to suggest a cellulitis or other cause of her symptoms. Patient was advised to followup with her primary care physician. She received a liter of normal saline IV bolus. Patient did continue to feel lightheaded at the time of discharge but preferred not to receive any more IV fluids and stated she could drink at home. Patient I discussed the possibility of CT of the head and my feeling that her symptoms likely would not correlate with finding on this given that she had more of a lightheadedness and this did appear to be related to standing. She understood that I was happy to perform this study but agreed that the risk of radiation did not seem worth the benefit of having this study performed today. Patient was discharged in good condition and can followup with her primary care physician. She was discharged with prescription for tramadol. I personally performed the services described in this documentation, which was scribed in my presence. The recorded information has been reviewed and considered. She did raise concerns that she might have autoimmune disease such as lupus as this runs in her family or multiple sclerosis we discussed that this is more frequently a diagnosis made on outpatient basis. Patient does have a PCP. She did not have any symptoms that would suggest need for emergent or any intervention today.          Cyndra Numbers, MD 06/22/12 2300  Cyndra Numbers, MD 06/22/12 [phone]

## 2012-06-22 NOTE — ED Notes (Signed)
Pt with multiple complaints reports dizziness bilat lower extremity swelling, intermittent  chest pain, lower abd pain, feels like pins and needles in legs, bilat arm pain and recent bruising, back pain. Pt denies urinary sx or fever. Upon assessment pt legs appear normal no pitting edema. Denies CP at present.

## 2012-06-26 ENCOUNTER — Emergency Department (HOSPITAL_BASED_OUTPATIENT_CLINIC_OR_DEPARTMENT_OTHER)
Admission: EM | Admit: 2012-06-26 | Discharge: 2012-06-26 | Disposition: A | Discharge status: Home / Self Care | Payer: Medicaid Other | Attending: Emergency Medicine | Admitting: Emergency Medicine

## 2012-06-26 ENCOUNTER — Encounter (HOSPITAL_BASED_OUTPATIENT_CLINIC_OR_DEPARTMENT_OTHER): Payer: Self-pay

## 2012-06-26 ENCOUNTER — Other Ambulatory Visit: Payer: Self-pay | Admitting: Internal Medicine

## 2012-06-26 ENCOUNTER — Emergency Department (HOSPITAL_BASED_OUTPATIENT_CLINIC_OR_DEPARTMENT_OTHER): Payer: Medicaid Other

## 2012-06-26 DIAGNOSIS — R110 Nausea: Secondary | ICD-10-CM | POA: Insufficient documentation

## 2012-06-26 DIAGNOSIS — R1013 Epigastric pain: Secondary | ICD-10-CM | POA: Insufficient documentation

## 2012-06-26 DIAGNOSIS — M129 Arthropathy, unspecified: Secondary | ICD-10-CM | POA: Insufficient documentation

## 2012-06-26 DIAGNOSIS — Z79899 Other long term (current) drug therapy: Secondary | ICD-10-CM | POA: Insufficient documentation

## 2012-06-26 LAB — COMPREHENSIVE METABOLIC PANEL
BUN: 8 mg/dL (ref 6–23)
CO2: 25 mEq/L (ref 19–32)
Calcium: 9 mg/dL (ref 8.4–10.5)
Creatinine, Ser: 1.1 mg/dL (ref 0.50–1.10)
GFR calc Af Amer: 72 mL/min — ABNORMAL LOW (ref >90)
GFR calc non Af Amer: 62 mL/min — ABNORMAL LOW (ref >90)
Glucose, Bld: 88 mg/dL (ref 70–99)

## 2012-06-26 LAB — URINE MICROSCOPIC-ADD ON

## 2012-06-26 LAB — URINALYSIS, ROUTINE W REFLEX MICROSCOPIC
Protein, ur: NEGATIVE mg/dL
Urobilinogen, UA: 1 mg/dL (ref 0.0–1.0)

## 2012-06-26 LAB — CBC WITH DIFFERENTIAL/PLATELET
Eosinophils Relative: 1 % (ref 0–5)
HCT: 35.3 % — ABNORMAL LOW (ref 36.0–46.0)
Lymphocytes Relative: 45 % (ref 12–46)
Lymphs Abs: 1.8 10*3/uL (ref 0.7–4.0)
MCV: 91.9 fL (ref 78.0–100.0)
Monocytes Absolute: 0.4 10*3/uL (ref 0.1–1.0)
Monocytes Relative: 11 % (ref 3–12)
RBC: 3.84 MIL/uL — ABNORMAL LOW (ref 3.87–5.11)
RDW: 12.2 % (ref 11.5–15.5)
WBC: 4 10*3/uL (ref 4.0–10.5)

## 2012-06-26 MED ORDER — PROMETHAZINE HCL 25 MG PO TABS: 25.0000 mg | ORAL_TABLET | Freq: Four times a day (QID) | ORAL | Status: DC | PRN

## 2012-06-26 MED ORDER — ONDANSETRON HCL 4 MG/2ML IJ SOLN
4.0000 mg | Freq: Once | INTRAMUSCULAR | Status: AC
  Administered 2012-06-26: 4 mg via INTRAVENOUS
  Filled 2012-06-26: qty 2

## 2012-06-26 MED ORDER — HYDROMORPHONE HCL PF 1 MG/ML IJ SOLN
1.0000 mg | Freq: Once | INTRAMUSCULAR | Status: AC
  Administered 2012-06-26: 1 mg via INTRAVENOUS
  Filled 2012-06-26: qty 1

## 2012-06-26 MED ORDER — HYDROCODONE-ACETAMINOPHEN 5-325 MG PO TABS: 1.0000 | ORAL_TABLET | Freq: Four times a day (QID) | ORAL | Status: AC | PRN

## 2012-06-26 MED ORDER — SODIUM CHLORIDE 0.9 % IV BOLUS (SEPSIS)
250.0000 mL | Freq: Once | INTRAVENOUS | Status: AC
  Administered 2012-06-26: 250 mL via INTRAVENOUS

## 2012-06-26 MED ORDER — SODIUM CHLORIDE 0.9 % IV SOLN
INTRAVENOUS | Status: DC
  Administered 2012-06-26: 16:00:00 via INTRAVENOUS

## 2012-06-26 MED ORDER — FAMOTIDINE 20 MG PO TABS: 20.0000 mg | ORAL_TABLET | Freq: Two times a day (BID) | ORAL | Status: DC

## 2012-06-26 NOTE — ED Notes (Signed)
Pt reports "right side" pain radiating to back. Seen here Monday for same.

## 2012-06-26 NOTE — ED Provider Notes (Signed)
History     CSN: [id number]  Arrival date & time 06/26/12  1345   First MD Initiated Contact with Patient 06/26/12 1506      Chief Complaint  Patient presents with  . Abdominal Pain    (Consider location/radiation/quality/duration/timing/severity/associated sxs/prior treatment) Patient is a 41 y.o. female presenting with abdominal pain. The history is provided by the patient.  Abdominal Pain The primary symptoms of the illness include abdominal pain and nausea. The primary symptoms of the illness do not include fever, fatigue, shortness of breath, vomiting, diarrhea or dysuria. The current episode started more than 2 days ago (Ongoing for 6 days.). The onset of the illness was sudden. The problem has not changed since onset. The pain came on suddenly. The abdominal pain is located in the RUQ. The abdominal pain radiates to the back. The severity of the abdominal pain is 10/10. The abdominal pain is relieved by nothing.  Symptoms associated with the illness do not include chills or back pain.   Pain is been ongoing for 6 days patient was seen on July 7 with negative labs. Pain in the right upper quadrant has persisted associated with nausea not getting any better. No vomiting no diarrhea no fever no lower quadrant abdominal pain no suprapubic pain no dysuria. Past Medical History  Diagnosis Date  . Fibromyalgia   . Arthritis   . Anemia     Past Surgical History  Procedure Date  . Cesarean section   . Foot surgery     Family History  Problem Relation Age of Onset  . Diabetes Mother   . Hypertension Mother   . Diabetes Sister   . Diabetes Maternal Grandmother   . Hypertension Maternal Grandmother   . Diabetes Paternal Grandmother   . Hypertension Paternal Grandmother   . Heart attack Neg Hx   . Hyperlipidemia Neg Hx   . Sudden death Neg Hx     History  Substance Use Topics  . Smoking status: Never Smoker   . Smokeless tobacco: Not on file  . Alcohol Use: No    OB  History    Grav Para Term Preterm Abortions TAB SAB Ect Mult Living                  Review of Systems  Constitutional: Negative for fever, chills and fatigue.  HENT: Negative for congestion.   Eyes: Negative for redness.  Respiratory: Negative for shortness of breath.   Cardiovascular: Negative for chest pain.  Gastrointestinal: Positive for nausea and abdominal pain. Negative for vomiting and diarrhea.  Genitourinary: Negative for dysuria.  Musculoskeletal: Negative for back pain.  Skin: Negative for rash.  Neurological: Negative for headaches.  Hematological: Does not bruise/bleed easily.    Allergies  Other and Percocet  Home Medications   Current Outpatient Rx  Name Route Sig Dispense Refill  . ETODOLAC 400 MG PO TABS Oral Take 1 tablet (400 mg total) by mouth 2 (two) times daily. 60 tablet 1  . FAMOTIDINE 20 MG PO TABS Oral Take 1 tablet (20 mg total) by mouth 2 (two) times daily. 30 tablet 0  . GABAPENTIN 300 MG PO CAPS Oral Take 300 mg by mouth 3 (three) times daily. Pt states that she only takes when she feels pain    . HYDROCODONE-ACETAMINOPHEN 5-325 MG PO TABS Oral Take 1-2 tablets by mouth every 6 (six) hours as needed for pain. 10 tablet 0  . COLD AND FLU PO Oral Take 2 tablets by mouth once.      Marland Kitchen  PROMETHAZINE HCL 25 MG PO TABS Oral Take 1 tablet (25 mg total) by mouth every 6 (six) hours as needed for nausea. 12 tablet 0  . TRAMADOL HCL 50 MG PO TABS Oral Take 1 tablet (50 mg total) by mouth every 8 (eight) hours as needed for pain. Maximum dose= 8 tablets per day 90 tablet 0  . TRAMADOL HCL 50 MG PO TABS  Take 1-2 tabs by mouth every 6 hours when necessary pain. 30 tablet 0    BP 129/80  Pulse 86  Temp 98.8 F (37.1 C) (Oral)  Resp 18  SpO2 99%  LMP 05/31/2012  Physical Exam  Nursing note and vitals reviewed. Constitutional: She is oriented to person, place, and time. She appears well-developed and well-nourished. No distress.  HENT:  Head:  Normocephalic and atraumatic.  Mouth/Throat: Oropharynx is clear and moist.  Eyes: Conjunctivae and EOM are normal. Pupils are equal, round, and reactive to light.  Neck: Normal range of motion. Neck supple.  Cardiovascular: Normal rate, regular rhythm and normal heart sounds.   No murmur heard. Pulmonary/Chest: Effort normal and breath sounds normal.  Abdominal: Soft. Bowel sounds are normal. There is tenderness.       Tender right upper quadrant epigastric area no guarding  Musculoskeletal: Normal range of motion. She exhibits no edema.  Neurological: She is alert and oriented to person, place, and time. No cranial nerve deficit. She exhibits normal muscle tone. Coordination normal.  Skin: Skin is warm. No rash noted.    ED Course  Procedures (including critical care time)  Labs Reviewed  URINALYSIS, ROUTINE W REFLEX MICROSCOPIC - Abnormal; Notable for the following:    APPearance CLOUDY (*)     Hgb urine dipstick MODERATE (*)     Leukocytes, UA MODERATE (*)     All other components within normal limits  URINE MICROSCOPIC-ADD ON - Abnormal; Notable for the following:    Squamous Epithelial / LPF MANY (*)     Bacteria, UA MANY (*)     All other components within normal limits  CBC WITH DIFFERENTIAL - Abnormal; Notable for the following:    RBC 3.84 (*)     Hemoglobin 11.7 (*)     HCT 35.3 (*)     All other components within normal limits  COMPREHENSIVE METABOLIC PANEL - Abnormal; Notable for the following:    GFR calc non Af Amer 62 (*)     GFR calc Af Amer 72 (*)     All other components within normal limits  PREGNANCY, URINE  LIPASE, BLOOD   US Abdomen Complete  06/26/2012  *RADIOLOGY REPORT*  Clinical Data:  41 year old female with abdominal pain.  COMPLETE ABDOMINAL ULTRASOUND  Comparison:  CT abdomen and pelvis 01/22/2011.  Findings:  Gallbladder:  There are shadowing from the fundus of the gallbladder, but this seems related to adjacent bowel gas.  Normal gallbladder  wall thickness of 2 mm.  However, a positive sonographic Murphy's sign was elicited.  No pericholecystic fluid. No definite cholelithiasis.  Common bile duct:  Normal measuring 2 mm in diameter.  Liver:  No focal lesion identified.  Within normal limits in parenchymal echogenicity.  IVC:  Appears normal.  Pancreas:  No focal abnormality seen.  Spleen:  Normal measuring 5.8 cm in length.  Right Kidney:  Normal measuring 10.0 cm in length.  Left Kidney:  Normal measuring 10.0 cm in length.  Abdominal aorta:  Incompletely visualized due to overlying bowel gas, visualized portions within normal limits.  IMPRESSION: 1.  Gallbladder fundus difficult to evaluate due to adjacent shadowing bowel gas.  No definite cholelithiasis or wall thickening identified, but positive sonographic Murphy's sign reported.  If clinical suspicion of acute cholecystitis persists, nuclear medicine hepatic biliary scan may be most valuable. 2.  No biliary ductal dilatation. 3.  Negative abdominal ultrasound otherwise.  Original Report Authenticated By: Harley Hallmark, M.D.     1. Epigastric abdominal pain       MDM  Workup for the epigastric abdominal pain is negative no evidence of gallbladder disease no leukocytosis no abnormalities of the liver function tests. Suspected maybe peptic ulcer disease related. Will treat with pain medicine and Pepcid for the next 2 weeks and antinausea medicine as needed patient will followup with her record Dr. in the next few days.   No evidence of acute surgical abdomen.        Shelda Jakes, MD 06/26/12 1946

## 2012-07-01 ENCOUNTER — Other Ambulatory Visit: Payer: Medicaid Other

## 2013-01-10 ENCOUNTER — Emergency Department (HOSPITAL_BASED_OUTPATIENT_CLINIC_OR_DEPARTMENT_OTHER): Payer: Medicaid Other

## 2013-01-10 ENCOUNTER — Encounter (HOSPITAL_BASED_OUTPATIENT_CLINIC_OR_DEPARTMENT_OTHER): Payer: Self-pay | Admitting: *Deleted

## 2013-01-10 ENCOUNTER — Emergency Department (HOSPITAL_BASED_OUTPATIENT_CLINIC_OR_DEPARTMENT_OTHER)
Admission: EM | Admit: 2013-01-10 | Discharge: 2013-01-10 | Disposition: A | Discharge status: Home / Self Care | Payer: Medicaid Other | Attending: Emergency Medicine | Admitting: Emergency Medicine

## 2013-01-10 DIAGNOSIS — Z8739 Personal history of other diseases of the musculoskeletal system and connective tissue: Secondary | ICD-10-CM | POA: Insufficient documentation

## 2013-01-10 DIAGNOSIS — R5381 Other malaise: Secondary | ICD-10-CM | POA: Insufficient documentation

## 2013-01-10 DIAGNOSIS — R52 Pain, unspecified: Secondary | ICD-10-CM | POA: Insufficient documentation

## 2013-01-10 DIAGNOSIS — IMO0001 Reserved for inherently not codable concepts without codable children: Secondary | ICD-10-CM | POA: Insufficient documentation

## 2013-01-10 DIAGNOSIS — R531 Weakness: Secondary | ICD-10-CM

## 2013-01-10 DIAGNOSIS — Z862 Personal history of diseases of the blood and blood-forming organs and certain disorders involving the immune mechanism: Secondary | ICD-10-CM | POA: Insufficient documentation

## 2013-01-10 LAB — URINALYSIS, ROUTINE W REFLEX MICROSCOPIC
Nitrite: NEGATIVE
Specific Gravity, Urine: 1.009 (ref 1.005–1.030)
Urobilinogen, UA: 1 mg/dL (ref 0.0–1.0)

## 2013-01-10 LAB — URINE MICROSCOPIC-ADD ON

## 2013-01-10 LAB — BASIC METABOLIC PANEL
GFR calc non Af Amer: 79 mL/min — ABNORMAL LOW (ref >90)
Glucose, Bld: 122 mg/dL — ABNORMAL HIGH (ref 70–99)
Potassium: 3.6 mEq/L (ref 3.5–5.1)
Sodium: 138 mEq/L (ref 135–145)

## 2013-01-10 LAB — CBC WITH DIFFERENTIAL/PLATELET
Basophils Absolute: 0 10*3/uL (ref 0.0–0.1)
Eosinophils Absolute: 0 10*3/uL (ref 0.0–0.7)
Lymphocytes Relative: 36 % (ref 12–46)
Lymphs Abs: 1.9 10*3/uL (ref 0.7–4.0)
MCH: 30.2 pg (ref 26.0–34.0)
Neutrophils Relative %: 56 % (ref 43–77)
Platelets: 288 10*3/uL (ref 150–400)
RBC: 3.54 MIL/uL — ABNORMAL LOW (ref 3.87–5.11)
WBC: 5.2 10*3/uL (ref 4.0–10.5)

## 2013-01-10 LAB — TROPONIN I: Troponin I: <0.3 ng/mL (ref <0.30)

## 2013-01-10 LAB — D-DIMER, QUANTITATIVE: D-Dimer, Quant: <0.27 ug/mL-FEU (ref 0.00–0.48)

## 2013-01-10 MED ORDER — SODIUM CHLORIDE 0.9 % IV BOLUS (SEPSIS)
1000.0000 mL | Freq: Once | INTRAVENOUS | Status: AC
  Administered 2013-01-10: 1000 mL via INTRAVENOUS

## 2013-01-10 MED ORDER — ONDANSETRON 4 MG PO TBDP: 4.0000 mg | ORAL_TABLET | Freq: Three times a day (TID) | ORAL | Status: DC | PRN

## 2013-01-10 MED ORDER — MORPHINE SULFATE 4 MG/ML IJ SOLN
4.0000 mg | Freq: Once | INTRAMUSCULAR | Status: AC
  Administered 2013-01-10: 4 mg via INTRAVENOUS
  Filled 2013-01-10: qty 1

## 2013-01-10 MED ORDER — TRAMADOL HCL 50 MG PO TABS: 50.0000 mg | ORAL_TABLET | Freq: Four times a day (QID) | ORAL | Status: DC | PRN

## 2013-01-10 MED ORDER — HYDROMORPHONE HCL PF 1 MG/ML IJ SOLN
1.0000 mg | Freq: Once | INTRAMUSCULAR | Status: AC
  Administered 2013-01-10: 1 mg via INTRAVENOUS
  Filled 2013-01-10: qty 1

## 2013-01-10 MED ORDER — ONDANSETRON HCL 4 MG/2ML IJ SOLN
4.0000 mg | Freq: Once | INTRAMUSCULAR | Status: AC
  Administered 2013-01-10: 4 mg via INTRAVENOUS
  Filled 2013-01-10: qty 2

## 2013-01-10 MED ORDER — SODIUM CHLORIDE 0.9 % IV SOLN
Freq: Once | INTRAVENOUS | Status: AC
  Administered 2013-01-10: 18:00:00 via INTRAVENOUS

## 2013-01-10 NOTE — ED Notes (Signed)
Body aches, weakness "for over a week"

## 2013-01-10 NOTE — ED Provider Notes (Signed)
History     CSN: [id number]  Arrival date & time 01/10/13  1425   First MD Initiated Contact with Patient 01/10/13 1504      Chief Complaint  Patient presents with  . Generalized Body Aches    (Consider location/radiation/quality/duration/timing/severity/associated sxs/prior treatment) HPI Comments: Patient is a 42 year old female who presents with a 1 week history of generalized weakness and body aches. Patient reports a gradual onset and progressive worsening of symptoms. Patient reports aching body pain. She has not tried anything for symptoms at home. Patient reports seeing her PCP who was going to do lab work, including TSH but patient never went to the lab. She has a past medical history of fibromyalgia, arthritis and anemia. Associated symptoms include central chest pain that is reproducible and SOB. No aggravating/alleviating factors.    Past Medical History  Diagnosis Date  . Fibromyalgia   . Arthritis   . Anemia     Past Surgical History  Procedure Date  . Cesarean section   . Foot surgery     Family History  Problem Relation Age of Onset  . Diabetes Mother   . Hypertension Mother   . Diabetes Sister   . Diabetes Maternal Grandmother   . Hypertension Maternal Grandmother   . Diabetes Paternal Grandmother   . Hypertension Paternal Grandmother   . Heart attack Neg Hx   . Hyperlipidemia Neg Hx   . Sudden death Neg Hx     History  Substance Use Topics  . Smoking status: Never Smoker   . Smokeless tobacco: Not on file  . Alcohol Use: No    OB History    Grav Para Term Preterm Abortions TAB SAB Ect Mult Living                  Review of Systems  Musculoskeletal: Positive for myalgias.  Neurological: Positive for weakness.  All other systems reviewed and are negative.    Allergies  Other and Percocet  Home Medications   Current Outpatient Rx  Name  Route  Sig  Dispense  Refill  . FLUOXETINE HCL 20 MG PO CAPS   Oral   Take 20 mg by mouth  daily.         Marland Kitchen LEVONORGESTREL-ETHINYL ESTRAD 0.15-30 MG-MCG PO TABS   Oral   Take 1 tablet by mouth daily.         Marland Kitchen FAMOTIDINE 20 MG PO TABS   Oral   Take 1 tablet (20 mg total) by mouth 2 (two) times daily.   30 tablet   0   . GABAPENTIN 300 MG PO CAPS   Oral   Take 300 mg by mouth 3 (three) times daily. Pt states that she only takes when she feels pain         . COLD AND FLU PO   Oral   Take 2 tablets by mouth once.           Marland Kitchen PROMETHAZINE HCL 25 MG PO TABS   Oral   Take 1 tablet (25 mg total) by mouth every 6 (six) hours as needed for nausea.   12 tablet   0   . TRAMADOL HCL 50 MG PO TABS      Take 1-2 tabs by mouth every 6 hours when necessary pain.   30 tablet   0     BP 112/67  Pulse 76  Temp 99.1 F (37.3 C) (Oral)  Resp 20  Ht 5\' 6"  (1.676  m)  Wt 149 lb (67.586 kg)  BMI 24.05 kg/m2  SpO2 100%  LMP 12/20/2012  Physical Exam  Nursing note and vitals reviewed. Constitutional: She is oriented to person, place, and time. She appears well-developed and well-nourished. No distress.  HENT:  Head: Normocephalic and atraumatic.  Eyes: Conjunctivae normal and EOM are normal.  Neck: Normal range of motion. Neck supple.  Cardiovascular: Normal rate, regular rhythm and intact distal pulses.  Exam reveals no gallop and no friction rub.   No murmur heard. Pulmonary/Chest: Effort normal and breath sounds normal. She has no wheezes. She has no rales. She exhibits tenderness.       Central chest tenderness to palpation.   Abdominal: Soft. She exhibits no distension. There is no tenderness. There is no rebound.  Musculoskeletal: Normal range of motion.       Left calf tenderness to palpation. No leg swelling noted.   Neurological: She is alert and oriented to person, place, and time. Coordination normal.       Speech is goal-oriented. Moves limbs without ataxia.   Skin: Skin is warm and dry. She is not diaphoretic.  Psychiatric: She has a normal mood and  affect. Her behavior is normal.    ED Course  Procedures (including critical care time)   Date: 01/10/2013  Rate: 67  Rhythm: normal sinus rhythm  QRS Axis: normal  Intervals: normal  ST/T Wave abnormalities: normal  Conduction Disutrbances:none  Narrative Interpretation: NSR unchanged from previous  Old EKG Reviewed: unchanged    Labs Reviewed  CBC WITH DIFFERENTIAL - Abnormal; Notable for the following:    RBC 3.54 (*)     Hemoglobin 10.7 (*)     HCT 33.1 (*)     All other components within normal limits  BASIC METABOLIC PANEL - Abnormal; Notable for the following:    Glucose, Bld 122 (*)     GFR calc non Af Amer 79 (*)     All other components within normal limits  URINALYSIS, ROUTINE W REFLEX MICROSCOPIC - Abnormal; Notable for the following:    APPearance CLOUDY (*)     Hgb urine dipstick SMALL (*)     Leukocytes, UA SMALL (*)     All other components within normal limits  URINE MICROSCOPIC-ADD ON - Abnormal; Notable for the following:    Bacteria, UA FEW (*)     All other components within normal limits  D-DIMER, QUANTITATIVE  URINE CULTURE  TROPONIN I  TSH   Dg Chest 2 View  01/10/2013  *RADIOLOGY REPORT*  Clinical Data: Cough, body aches  CHEST - 2 VIEW  Comparison: 06/22/2012  Findings: Cardiomediastinal silhouette is stable.  No acute infiltrate or pleural effusion.  No pulmonary edema.  Bony thorax is unremarkable.  IMPRESSION: No active disease.   Original Report Authenticated By: Natasha Mead, M.D.      1. Generalized weakness   2. Generalized pain       MDM  3:39 PM Labs pending. D-dimer. Patient will have fluids and morphine for pain.   6:57 PM Labs unremarkable. D-dimer negative. Patient feels the same as before. EKG unremarkable. Troponin and TSH pending.   8:36 PM Patient reports some improvement of her symptoms. Patient will be contacted with pending TSH results. Vitals stable for discharge. Patient will follow up with her PCP for further  evaluation. Patient instructed to return with worsening or concerning symptoms.    Emilia Beck, PA-C 01/14/13 1237

## 2013-01-12 LAB — URINE CULTURE: Colony Count: 5000

## 2013-01-27 ENCOUNTER — Other Ambulatory Visit: Payer: Self-pay | Admitting: Internal Medicine

## 2013-01-27 DIAGNOSIS — R413 Other amnesia: Secondary | ICD-10-CM

## 2013-01-27 DIAGNOSIS — R51 Headache: Secondary | ICD-10-CM

## 2013-01-27 NOTE — ED Provider Notes (Signed)
Medical screening examination/treatment/procedure(s) were performed by non-physician practitioner and as supervising physician I was immediately available for consultation/collaboration.   Amarius Toto, MD 01/27/13 0948 

## 2013-01-27 NOTE — ED Provider Notes (Signed)
Medical screening examination/treatment/procedure(s) were performed by non-physician practitioner and as supervising physician I was immediately available for consultation/collaboration.   [name], MD 01/27/13 [phone]

## 2013-02-02 ENCOUNTER — Ambulatory Visit
Admission: RE | Admit: 2013-02-02 | Discharge: 2013-02-02 | Disposition: A | Discharge status: Home / Self Care | Payer: Medicaid Other | Source: Ambulatory Visit | Attending: Internal Medicine | Admitting: Internal Medicine

## 2013-02-02 DIAGNOSIS — R51 Headache: Secondary | ICD-10-CM

## 2013-02-02 DIAGNOSIS — R413 Other amnesia: Secondary | ICD-10-CM

## 2013-04-20 ENCOUNTER — Other Ambulatory Visit: Payer: Self-pay | Admitting: Internal Medicine

## 2013-04-20 DIAGNOSIS — Z1231 Encounter for screening mammogram for malignant neoplasm of breast: Secondary | ICD-10-CM

## 2013-05-05 ENCOUNTER — Emergency Department (HOSPITAL_BASED_OUTPATIENT_CLINIC_OR_DEPARTMENT_OTHER): Payer: Medicaid Other

## 2013-05-05 ENCOUNTER — Emergency Department (HOSPITAL_BASED_OUTPATIENT_CLINIC_OR_DEPARTMENT_OTHER)
Admission: EM | Admit: 2013-05-05 | Discharge: 2013-05-05 | Disposition: A | Discharge status: Home / Self Care | Payer: Medicaid Other | Attending: Emergency Medicine | Admitting: Emergency Medicine

## 2013-05-05 ENCOUNTER — Encounter (HOSPITAL_BASED_OUTPATIENT_CLINIC_OR_DEPARTMENT_OTHER): Payer: Self-pay

## 2013-05-05 DIAGNOSIS — Z8739 Personal history of other diseases of the musculoskeletal system and connective tissue: Secondary | ICD-10-CM | POA: Insufficient documentation

## 2013-05-05 DIAGNOSIS — N390 Urinary tract infection, site not specified: Secondary | ICD-10-CM | POA: Insufficient documentation

## 2013-05-05 DIAGNOSIS — Z3202 Encounter for pregnancy test, result negative: Secondary | ICD-10-CM | POA: Insufficient documentation

## 2013-05-05 DIAGNOSIS — R509 Fever, unspecified: Secondary | ICD-10-CM | POA: Insufficient documentation

## 2013-05-05 DIAGNOSIS — R5381 Other malaise: Secondary | ICD-10-CM | POA: Insufficient documentation

## 2013-05-05 DIAGNOSIS — Z79899 Other long term (current) drug therapy: Secondary | ICD-10-CM | POA: Insufficient documentation

## 2013-05-05 DIAGNOSIS — R0789 Other chest pain: Secondary | ICD-10-CM | POA: Insufficient documentation

## 2013-05-05 DIAGNOSIS — R300 Dysuria: Secondary | ICD-10-CM | POA: Insufficient documentation

## 2013-05-05 DIAGNOSIS — R51 Headache: Secondary | ICD-10-CM | POA: Insufficient documentation

## 2013-05-05 DIAGNOSIS — Z862 Personal history of diseases of the blood and blood-forming organs and certain disorders involving the immune mechanism: Secondary | ICD-10-CM | POA: Insufficient documentation

## 2013-05-05 DIAGNOSIS — M542 Cervicalgia: Secondary | ICD-10-CM | POA: Insufficient documentation

## 2013-05-05 DIAGNOSIS — R5383 Other fatigue: Secondary | ICD-10-CM | POA: Insufficient documentation

## 2013-05-05 DIAGNOSIS — R110 Nausea: Secondary | ICD-10-CM | POA: Insufficient documentation

## 2013-05-05 DIAGNOSIS — R42 Dizziness and giddiness: Secondary | ICD-10-CM | POA: Insufficient documentation

## 2013-05-05 LAB — CBC WITH DIFFERENTIAL/PLATELET
Basophils Absolute: 0 10*3/uL (ref 0.0–0.1)
Eosinophils Relative: 1 % (ref 0–5)
HCT: 36.3 % (ref 36.0–46.0)
Lymphocytes Relative: 41 % (ref 12–46)
MCH: 30.8 pg (ref 26.0–34.0)
MCHC: 32.8 g/dL (ref 30.0–36.0)
MCV: 94 fL (ref 78.0–100.0)
Monocytes Absolute: 0.4 10*3/uL (ref 0.1–1.0)
RDW: 12.7 % (ref 11.5–15.5)
WBC: 4.8 10*3/uL (ref 4.0–10.5)

## 2013-05-05 LAB — COMPREHENSIVE METABOLIC PANEL
AST: 14 U/L (ref 0–37)
CO2: 27 mEq/L (ref 19–32)
Calcium: 9.4 mg/dL (ref 8.4–10.5)
Creatinine, Ser: 0.9 mg/dL (ref 0.50–1.10)
GFR calc Af Amer: >90 mL/min (ref >90)
GFR calc non Af Amer: 79 mL/min — ABNORMAL LOW (ref >90)

## 2013-05-05 LAB — URINALYSIS, ROUTINE W REFLEX MICROSCOPIC
Bilirubin Urine: NEGATIVE
Protein, ur: NEGATIVE mg/dL
Urobilinogen, UA: 1 mg/dL (ref 0.0–1.0)

## 2013-05-05 LAB — URINE MICROSCOPIC-ADD ON

## 2013-05-05 MED ORDER — TRAMADOL HCL 50 MG PO TABS: 50.0000 mg | ORAL_TABLET | Freq: Four times a day (QID) | ORAL | Status: DC | PRN

## 2013-05-05 MED ORDER — AMOXICILLIN 500 MG PO CAPS: 500.0000 mg | ORAL_CAPSULE | Freq: Three times a day (TID) | ORAL | Status: DC

## 2013-05-05 NOTE — ED Provider Notes (Signed)
History     CSN: [id number]  Arrival date & time 05/05/13  1751   First MD Initiated Contact with Patient 05/05/13 1823      Chief Complaint  Patient presents with  . Nausea    (Consider location/radiation/quality/duration/timing/severity/associated sxs/prior treatment) Patient is a 42 y.o. female presenting with abdominal pain. The history is provided by the patient. No language interpreter was used.  Abdominal Pain This is a new problem. Episode onset: 2 days. The problem occurs constantly. The problem has been unchanged. Associated symptoms include abdominal pain, chest pain, chills, fatigue, a fever, headaches, nausea and neck pain. Pertinent negatives include no rash or vomiting. Nothing aggravates the symptoms. She has tried nothing for the symptoms. The treatment provided mild relief.   Pt complains of soreness in her neck,  Chest tightness, abdominal discomfort.  Pt complains of feeling lightheaded and nauseated.   Past Medical History  Diagnosis Date  . Fibromyalgia   . Arthritis   . Anemia     Past Surgical History  Procedure Laterality Date  . Cesarean section    . Foot surgery      Family History  Problem Relation Age of Onset  . Diabetes Mother   . Hypertension Mother   . Diabetes Sister   . Diabetes Maternal Grandmother   . Hypertension Maternal Grandmother   . Diabetes Paternal Grandmother   . Hypertension Paternal Grandmother   . Heart attack Neg Hx   . Hyperlipidemia Neg Hx   . Sudden death Neg Hx     History  Substance Use Topics  . Smoking status: Never Smoker   . Smokeless tobacco: Not on file  . Alcohol Use: No    OB History   Grav Para Term Preterm Abortions TAB SAB Ect Mult Living                  Review of Systems  Constitutional: Positive for fever, chills and fatigue.  HENT: Positive for neck pain.   Cardiovascular: Positive for chest pain.  Gastrointestinal: Positive for nausea and abdominal pain. Negative for vomiting and  diarrhea.  Genitourinary: Positive for dysuria.  Skin: Negative for rash.  Neurological: Positive for dizziness and headaches.  All other systems reviewed and are negative.    Allergies  Other and Percocet  Home Medications   Current Outpatient Rx  Name  Route  Sig  Dispense  Refill  . Butalbital-APAP-Caffeine (FIORICET PO)   Oral   Take by mouth.         Marland Kitchen HYDROcodone-acetaminophen (NORCO/VICODIN) 5-325 MG per tablet   Oral   Take 1 tablet by mouth every 6 (six) hours as needed for pain.         . famotidine (PEPCID) 20 MG tablet   Oral   Take 1 tablet (20 mg total) by mouth 2 (two) times daily.   30 tablet   0   . FLUoxetine (PROZAC) 20 MG capsule   Oral   Take 20 mg by mouth daily.         Marland Kitchen gabapentin (NEURONTIN) 300 MG capsule   Oral   Take 300 mg by mouth 3 (three) times daily. Pt states that she only takes when she feels pain         . levonorgestrel-ethinyl estradiol (NORDETTE) 0.15-30 MG-MCG tablet   Oral   Take 1 tablet by mouth daily.         . Nutritional Supplements (COLD AND FLU PO)   Oral   Take  2 tablets by mouth once.           . ondansetron (ZOFRAN ODT) 4 MG disintegrating tablet   Oral   Take 1 tablet (4 mg total) by mouth every 8 (eight) hours as needed for nausea.   10 tablet   0   . EXPIRED: promethazine (PHENERGAN) 25 MG tablet   Oral   Take 1 tablet (25 mg total) by mouth every 6 (six) hours as needed for nausea.   12 tablet   0   . traMADol (ULTRAM) 50 MG tablet      Take 1-2 tabs by mouth every 6 hours when necessary pain.   30 tablet   0   . traMADol (ULTRAM) 50 MG tablet   Oral   Take 1 tablet (50 mg total) by mouth every 6 (six) hours as needed for pain.   15 tablet   0     BP 108/65  Pulse 78  Temp(Src) 98.9 F (37.2 C) (Oral)  Resp 16  Ht 5\' 5"  (1.651 m)  Wt 148 lb (67.132 kg)  BMI 24.63 kg/m2  SpO2 99%  LMP 04/25/2013  Physical Exam  Nursing note and vitals reviewed. Constitutional: She is  oriented to person, place, and time. She appears well-developed and well-nourished.  HENT:  Head: Normocephalic and atraumatic.  Right Ear: External ear normal.  Left Ear: External ear normal.  Nose: Nose normal.  Mouth/Throat: Oropharynx is clear and moist.  Eyes: EOM are normal. Pupils are equal, round, and reactive to light.  Neck: Normal range of motion.  Cardiovascular: Normal rate.   Pulmonary/Chest: Effort normal and breath sounds normal.  Abdominal: Soft. Bowel sounds are normal. She exhibits no distension.  Musculoskeletal: Normal range of motion.  Neurological: She is alert and oriented to person, place, and time.  Skin: Skin is warm.  Psychiatric: She has a normal mood and affect.    ED Course  Procedures (including critical care time)  Labs Reviewed  URINALYSIS, ROUTINE W REFLEX MICROSCOPIC - Abnormal; Notable for the following:    APPearance CLOUDY (*)    Hgb urine dipstick MODERATE (*)    Leukocytes, UA SMALL (*)    All other components within normal limits  URINE MICROSCOPIC-ADD ON - Abnormal; Notable for the following:    Squamous Epithelial / LPF FEW (*)    Bacteria, UA FEW (*)    All other components within normal limits  CBC WITH DIFFERENTIAL - Abnormal; Notable for the following:    RBC 3.86 (*)    Hemoglobin 11.9 (*)    All other components within normal limits  COMPREHENSIVE METABOLIC PANEL - Abnormal; Notable for the following:    GFR calc non Af Amer 79 (*)    All other components within normal limits  URINE CULTURE  PREGNANCY, URINE   Dg Chest 2 View  05/05/2013   *RADIOLOGY REPORT*  Clinical Data: Shortness of breath  CHEST - 2 VIEW  Comparison: 01/10/2013  Findings: Mild rightward curvature of the thoracolumbar spine is noted centered at T12. Cardiomediastinal silhouette is within normal limits. The lungs are clear. No pleural effusion.  No pneumothorax.  No acute osseous abnormality.  IMPRESSION: No acute cardiopulmonary process.   Original Report  Authenticated By: Christiana Pellant, M.D.     No diagnosis found.    MDM   Date: 05/05/2013  Rate: 66  Rhythm: normal sinus rhythm  QRS Axis: normal  Intervals: normal  ST/T Wave abnormalities: normal  Conduction Disutrbances:none  Narrative Interpretation:  Old EKG Reviewed: unchanged     Chest xray is normal,  EKG is normal.  Labs are normal.  Urine shows 3-6 WBC's and few bacteria.   Pt started on amoxicillian for uti.   I will culture urine.   Pt advised to follow up with her MD for recheck in 2-3 days.    Lonia Skinner [place], PA-C 05/05/13 2019

## 2013-05-05 NOTE — ED Provider Notes (Signed)
Medical screening examination/treatment/procedure(s) were performed by non-physician practitioner and as supervising physician I was immediately available for consultation/collaboration.  Doug Sou, MD 05/05/13 2041

## 2013-05-05 NOTE — ED Notes (Signed)
C/o neck pain, abd pain, nausea, lightheaded x 2 days

## 2013-05-05 NOTE — ED Notes (Signed)
Patient transported to X-ray 

## 2013-05-06 LAB — URINE CULTURE

## 2013-05-28 ENCOUNTER — Ambulatory Visit
Admission: RE | Admit: 2013-05-28 | Discharge: 2013-05-28 | Disposition: A | Discharge status: Home / Self Care | Payer: Medicaid Other | Source: Ambulatory Visit | Attending: Internal Medicine | Admitting: Internal Medicine

## 2013-05-28 DIAGNOSIS — Z1231 Encounter for screening mammogram for malignant neoplasm of breast: Secondary | ICD-10-CM

## 2013-09-30 ENCOUNTER — Ambulatory Visit: Payer: Medicaid Other | Admitting: Rehabilitation

## 2013-10-05 ENCOUNTER — Ambulatory Visit: Payer: Medicaid Other | Attending: Internal Medicine | Admitting: Physical Therapy

## 2013-10-19 ENCOUNTER — Ambulatory Visit: Payer: Medicaid Other | Attending: Internal Medicine | Admitting: Rehabilitation

## 2013-11-03 ENCOUNTER — Encounter (HOSPITAL_BASED_OUTPATIENT_CLINIC_OR_DEPARTMENT_OTHER): Payer: Self-pay | Admitting: Emergency Medicine

## 2013-11-03 ENCOUNTER — Emergency Department (HOSPITAL_BASED_OUTPATIENT_CLINIC_OR_DEPARTMENT_OTHER): Payer: Medicaid Other

## 2013-11-03 ENCOUNTER — Emergency Department (HOSPITAL_BASED_OUTPATIENT_CLINIC_OR_DEPARTMENT_OTHER)
Admission: EM | Admit: 2013-11-03 | Discharge: 2013-11-04 | Disposition: A | Discharge status: Home / Self Care | Payer: Medicaid Other | Attending: Emergency Medicine | Admitting: Emergency Medicine

## 2013-11-03 DIAGNOSIS — R109 Unspecified abdominal pain: Secondary | ICD-10-CM

## 2013-11-03 DIAGNOSIS — IMO0001 Reserved for inherently not codable concepts without codable children: Secondary | ICD-10-CM | POA: Insufficient documentation

## 2013-11-03 DIAGNOSIS — Z9889 Other specified postprocedural states: Secondary | ICD-10-CM | POA: Insufficient documentation

## 2013-11-03 DIAGNOSIS — Z79899 Other long term (current) drug therapy: Secondary | ICD-10-CM | POA: Insufficient documentation

## 2013-11-03 DIAGNOSIS — N390 Urinary tract infection, site not specified: Secondary | ICD-10-CM | POA: Insufficient documentation

## 2013-11-03 DIAGNOSIS — M129 Arthropathy, unspecified: Secondary | ICD-10-CM | POA: Insufficient documentation

## 2013-11-03 DIAGNOSIS — A499 Bacterial infection, unspecified: Secondary | ICD-10-CM | POA: Insufficient documentation

## 2013-11-03 DIAGNOSIS — N760 Acute vaginitis: Secondary | ICD-10-CM | POA: Insufficient documentation

## 2013-11-03 DIAGNOSIS — Z862 Personal history of diseases of the blood and blood-forming organs and certain disorders involving the immune mechanism: Secondary | ICD-10-CM | POA: Insufficient documentation

## 2013-11-03 DIAGNOSIS — B9689 Other specified bacterial agents as the cause of diseases classified elsewhere: Secondary | ICD-10-CM | POA: Insufficient documentation

## 2013-11-03 DIAGNOSIS — Z3202 Encounter for pregnancy test, result negative: Secondary | ICD-10-CM | POA: Insufficient documentation

## 2013-11-03 LAB — URINALYSIS, ROUTINE W REFLEX MICROSCOPIC
Nitrite: NEGATIVE
Specific Gravity, Urine: 1.02 (ref 1.005–1.030)
Urobilinogen, UA: 1 mg/dL (ref 0.0–1.0)

## 2013-11-03 LAB — URINE MICROSCOPIC-ADD ON

## 2013-11-03 LAB — COMPREHENSIVE METABOLIC PANEL
ALT: 7 U/L (ref 0–35)
AST: 14 U/L (ref 0–37)
Albumin: 3.9 g/dL (ref 3.5–5.2)
CO2: 26 mEq/L (ref 19–32)
Chloride: 104 mEq/L (ref 96–112)
GFR calc non Af Amer: 78 mL/min — ABNORMAL LOW (ref >90)
Sodium: 139 mEq/L (ref 135–145)
Total Bilirubin: 0.3 mg/dL (ref 0.3–1.2)

## 2013-11-03 LAB — CBC WITH DIFFERENTIAL/PLATELET
Basophils Absolute: 0 10*3/uL (ref 0.0–0.1)
Lymphocytes Relative: 45 % (ref 12–46)
Neutro Abs: 2.1 10*3/uL (ref 1.7–7.7)
Neutrophils Relative %: 44 % (ref 43–77)
Platelets: 296 10*3/uL (ref 150–400)
RDW: 12.2 % (ref 11.5–15.5)
WBC: 4.6 10*3/uL (ref 4.0–10.5)

## 2013-11-03 LAB — PREGNANCY, URINE: Preg Test, Ur: NEGATIVE

## 2013-11-03 LAB — WET PREP, GENITAL

## 2013-11-03 MED ORDER — CIPROFLOXACIN HCL 500 MG PO TABS: 500.0000 mg | ORAL_TABLET | Freq: Two times a day (BID) | ORAL | Status: DC

## 2013-11-03 MED ORDER — SODIUM CHLORIDE 0.9 % IV BOLUS (SEPSIS)
1000.0000 mL | Freq: Once | INTRAVENOUS | Status: AC
  Administered 2013-11-03: 1000 mL via INTRAVENOUS

## 2013-11-03 MED ORDER — HYDROMORPHONE HCL PF 1 MG/ML IJ SOLN
1.0000 mg | Freq: Once | INTRAMUSCULAR | Status: AC
  Administered 2013-11-03: 1 mg via INTRAVENOUS
  Filled 2013-11-03: qty 1

## 2013-11-03 MED ORDER — HYDROCODONE-ACETAMINOPHEN 5-325 MG PO TABS: 2.0000 | ORAL_TABLET | ORAL | Status: DC | PRN

## 2013-11-03 MED ORDER — METRONIDAZOLE 500 MG PO TABS: 500.0000 mg | ORAL_TABLET | Freq: Two times a day (BID) | ORAL | Status: DC

## 2013-11-03 MED ORDER — ONDANSETRON HCL 4 MG/2ML IJ SOLN
4.0000 mg | Freq: Once | INTRAMUSCULAR | Status: AC
  Administered 2013-11-03: 4 mg via INTRAVENOUS
  Filled 2013-11-03: qty 2

## 2013-11-03 MED ORDER — MORPHINE SULFATE 4 MG/ML IJ SOLN
4.0000 mg | Freq: Once | INTRAMUSCULAR | Status: AC
  Administered 2013-11-03: 4 mg via INTRAVENOUS
  Filled 2013-11-03: qty 1

## 2013-11-03 MED ORDER — HYDROMORPHONE HCL PF 1 MG/ML IJ SOLN
INTRAMUSCULAR | Status: AC
  Filled 2013-11-03: qty 1

## 2013-11-03 NOTE — ED Provider Notes (Signed)
CSN: [id number]     Arrival date & time 11/03/13  2056 History   First MD Initiated Contact with Patient 11/03/13 2112     This chart was scribed for Stefanie Bucco, MD by Manuela Schwartz, ED scribe. This patient was seen in room MH06/MH06 and the patient's care was started at 2112.  Chief Complaint  Patient presents with  . Abdominal Pain   Patient is a 42 y.o. female presenting with abdominal pain. The history is provided by the patient. No language interpreter was used.  Abdominal Pain Pain location:  LLQ, RLQ and R flank Pain quality: cramping and sharp   Pain severity:  Moderate Onset quality:  Gradual Duration:  3 days Timing:  Intermittent Progression:  Worsening Chronicity:  New Context: not eating, not medication withdrawal, not recent illness, not sick contacts and not trauma   Relieved by:  Nothing Worsened by:  Nothing tried Ineffective treatments: gas medicines. Associated symptoms: nausea   Associated symptoms: no chest pain, no chills, no cough, no diarrhea, no dysuria, no fever, no shortness of breath, no vaginal bleeding, no vaginal discharge and no vomiting    HPI Comments: [name] is a 42 y.o. female who presents to the Emergency Department complaining of intermittent bilateral lower, cramping/sharp abdominal pain (worse on right lower side), which radiates to her right flank. She states similar symptoms to previous episode of fibroids. She states no relief w/gas medicines. She denies dysuria, vaginal d/c or bleeding. She states nausea but denies emesis episodes.   Past Medical History  Diagnosis Date  . Fibromyalgia   . Arthritis   . Anemia    Past Surgical History  Procedure Laterality Date  . Cesarean section    . Foot surgery     Family History  Problem Relation Age of Onset  . Diabetes Mother   . Hypertension Mother   . Diabetes Sister   . Diabetes Maternal Grandmother   . Hypertension Maternal Grandmother   . Diabetes Paternal Grandmother   .  Hypertension Paternal Grandmother   . Heart attack Neg Hx   . Hyperlipidemia Neg Hx   . Sudden death Neg Hx    History  Substance Use Topics  . Smoking status: Never Smoker   . Smokeless tobacco: Not on file  . Alcohol Use: No   OB History   Grav Para Term Preterm Abortions TAB SAB Ect Mult Living                 Review of Systems  Constitutional: Negative for fever and chills.  HENT: Negative for congestion and rhinorrhea.   Respiratory: Negative for cough and shortness of breath.   Cardiovascular: Negative for chest pain.  Gastrointestinal: Positive for nausea and abdominal pain (bilateral lower abdominal pain). Negative for vomiting and diarrhea.  Genitourinary: Negative for dysuria, vaginal bleeding and vaginal discharge.  Musculoskeletal: Negative for back pain.  Skin: Negative for color change and rash.  Neurological: Negative for syncope.  All other systems reviewed and are negative.   A complete 10 system review of systems was obtained and all systems are negative except as noted in the HPI and PMH.   Allergies  Other and Percocet  Home Medications   Current Outpatient Rx  Name  Route  Sig  Dispense  Refill  . amoxicillin (AMOXIL) 500 MG capsule   Oral   Take 1 capsule (500 mg total) by mouth 3 (three) times daily.   21 capsule   0   . Butalbital-APAP-Caffeine (  FIORICET PO)   Oral   Take by mouth.         . ciprofloxacin (CIPRO) 500 MG tablet   Oral   Take 1 tablet (500 mg total) by mouth 2 (two) times daily. One po bid x 7 days   14 tablet   0   . EXPIRED: famotidine (PEPCID) 20 MG tablet   Oral   Take 1 tablet (20 mg total) by mouth 2 (two) times daily.   30 tablet   0   . FLUoxetine (PROZAC) 20 MG capsule   Oral   Take 20 mg by mouth daily.         Marland Kitchen gabapentin (NEURONTIN) 300 MG capsule   Oral   Take 300 mg by mouth 3 (three) times daily. Pt states that she only takes when she feels pain         . HYDROcodone-acetaminophen  (NORCO/VICODIN) 5-325 MG per tablet   Oral   Take 1 tablet by mouth every 6 (six) hours as needed for pain.         Marland Kitchen HYDROcodone-acetaminophen (NORCO/VICODIN) 5-325 MG per tablet   Oral   Take 2 tablets by mouth every 4 (four) hours as needed.   15 tablet   0   . levonorgestrel-ethinyl estradiol (NORDETTE) 0.15-30 MG-MCG tablet   Oral   Take 1 tablet by mouth daily.         . metroNIDAZOLE (FLAGYL) 500 MG tablet   Oral   Take 1 tablet (500 mg total) by mouth 2 (two) times daily. One po bid x 7 days   14 tablet   0   . Nutritional Supplements (COLD AND FLU PO)   Oral   Take 2 tablets by mouth once.           . ondansetron (ZOFRAN ODT) 4 MG disintegrating tablet   Oral   Take 1 tablet (4 mg total) by mouth every 8 (eight) hours as needed for nausea.   10 tablet   0   . EXPIRED: promethazine (PHENERGAN) 25 MG tablet   Oral   Take 1 tablet (25 mg total) by mouth every 6 (six) hours as needed for nausea.   12 tablet   0   . traMADol (ULTRAM) 50 MG tablet      Take 1-2 tabs by mouth every 6 hours when necessary pain.   30 tablet   0   . traMADol (ULTRAM) 50 MG tablet   Oral   Take 1 tablet (50 mg total) by mouth every 6 (six) hours as needed for pain.   15 tablet   0   . traMADol (ULTRAM) 50 MG tablet   Oral   Take 1 tablet (50 mg total) by mouth every 6 (six) hours as needed for pain.   15 tablet   0    TriageBP 104/59  Pulse 75  Temp(Src) 98.6 F (37 C) (Oral)  Resp 16  Ht 5\' 5"  (1.651 m)  Wt 140 lb (63.504 kg)  BMI 23.30 kg/m2  SpO2 100%  LMP 10/13/2013 Physical Exam  Constitutional: She is oriented to person, place, and time. She appears well-developed and well-nourished.  HENT:  Head: Normocephalic and atraumatic.  Eyes: Pupils are equal, round, and reactive to light.  Neck: Normal range of motion. Neck supple.  Cardiovascular: Normal rate, regular rhythm and normal heart sounds.   Pulmonary/Chest: Effort normal and breath sounds normal.  No respiratory distress. She has no wheezes. She has no rales. She  exhibits no tenderness.  Abdominal: Soft. Bowel sounds are normal. There is tenderness (moderate tenderness to suprapubic and pelvic area bilaterally, more on right.  +CVA tenderness to right). There is no rebound and no guarding.  Genitourinary:  +moderate white discharge.  +mild right adnexal tenderness  Musculoskeletal: Normal range of motion. She exhibits no edema.  Lymphadenopathy:    She has no cervical adenopathy.  Neurological: She is alert and oriented to person, place, and time.  Skin: Skin is warm and dry. No rash noted.  Psychiatric: She has a normal mood and affect.    ED Course  Procedures (including critical care time) DIAGNOSTIC STUDIES: Oxygen Saturation is 100% on room air, normal by my interpretation.    COORDINATION OF CARE: At 915 PM Discussed treatment plan with patient which includes blood work, UA, pain medicine, IV fluids. Patient agrees.   Labs Review Labs Reviewed  WET PREP, GENITAL - Abnormal; Notable for the following:    Clue Cells Wet Prep HPF POC MANY (*)    WBC, Wet Prep HPF POC TOO NUMEROUS TO COUNT (*)    All other components within normal limits  URINALYSIS, ROUTINE W REFLEX MICROSCOPIC - Abnormal; Notable for the following:    APPearance CLOUDY (*)    Hgb urine dipstick TRACE (*)    Leukocytes, UA LARGE (*)    All other components within normal limits  CBC WITH DIFFERENTIAL - Abnormal; Notable for the following:    RBC 3.59 (*)    Hemoglobin 10.9 (*)    HCT 33.8 (*)    All other components within normal limits  COMPREHENSIVE METABOLIC PANEL - Abnormal; Notable for the following:    GFR calc non Af Amer 78 (*)    All other components within normal limits  URINE MICROSCOPIC-ADD ON - Abnormal; Notable for the following:    Squamous Epithelial / LPF MANY (*)    Bacteria, UA FEW (*)    All other components within normal limits  GC/CHLAMYDIA PROBE AMP  URINE CULTURE   PREGNANCY, URINE   Imaging Review Ct Abdomen Pelvis Wo Contrast  11/03/2013   CLINICAL DATA:  Right flank and pelvic pain  EXAM: CT ABDOMEN AND PELVIS WITHOUT CONTRAST  TECHNIQUE: Multidetector CT imaging of the abdomen and pelvis was performed following the standard protocol without intravenous contrast.  COMPARISON:  01/22/2011  FINDINGS: BODY WALL: Unremarkable.  LOWER CHEST: Unremarkable.  ABDOMEN/PELVIS:  Liver: No focal abnormality.  Biliary: No evidence of biliary obstruction or stone.  Pancreas: Unremarkable.  Spleen: Unremarkable.  Adrenals: Unremarkable.  Kidneys and ureters: No hydronephrosis or stone.  Bladder: Unremarkable.  Reproductive: Unremarkable.  Bowel: No obstruction. Normal appendix.  Retroperitoneum: No mass or adenopathy.  Peritoneum: Small volume free pelvic fluid which is water density.  Vascular: No acute abnormality.  OSSEOUS: No acute abnormalities.  IMPRESSION: 1. No evidence acute intra-abdominal disease. No urolithiasis or hydronephrosis. 2. Small volume free pelvic fluid.   Electronically Signed   By: Tiburcio Pea M.D.   On: 11/03/2013 23:19    EKG Interpretation   None       MDM   1. Abdominal pain   2. BV (bacterial vaginosis)   3. UTI (lower urinary tract infection)    Pt with lower abd pain.  Seems more pelvic in origin.  Has been constant for 3 days.  Doubt torsion.  Similar to pain from past fibroids.  No ureteral stones.  Normal appendix.  Will check pelvic u/s tomorrow as outpt.  Encouraged f/u  with her PMD.  Will tx with cipro/flagyl for UTI, BV.  I personally performed the services described in this documentation, which was scribed in my presence.  The recorded information has been reviewed and considered.     Stefanie Bucco, MD 11/04/13 [phone]

## 2013-11-03 NOTE — ED Notes (Signed)
abd pain x3 days

## 2013-11-03 NOTE — ED Notes (Signed)
MD at bedside. 

## 2013-11-03 NOTE — ED Notes (Signed)
Patient transported to CT via stretcher per tech. 

## 2013-11-04 ENCOUNTER — Other Ambulatory Visit (HOSPITAL_BASED_OUTPATIENT_CLINIC_OR_DEPARTMENT_OTHER): Payer: Self-pay | Admitting: Emergency Medicine

## 2013-11-04 ENCOUNTER — Telehealth (HOSPITAL_COMMUNITY): Payer: Self-pay | Admitting: *Deleted

## 2013-11-04 ENCOUNTER — Ambulatory Visit (HOSPITAL_BASED_OUTPATIENT_CLINIC_OR_DEPARTMENT_OTHER)
Admission: RE | Admit: 2013-11-04 | Discharge: 2013-11-04 | Disposition: A | Discharge status: Home / Self Care | Payer: Medicaid Other | Source: Ambulatory Visit | Attending: Emergency Medicine | Admitting: Emergency Medicine

## 2013-11-04 DIAGNOSIS — D259 Leiomyoma of uterus, unspecified: Secondary | ICD-10-CM | POA: Insufficient documentation

## 2013-11-04 DIAGNOSIS — R102 Pelvic and perineal pain: Secondary | ICD-10-CM

## 2013-11-04 DIAGNOSIS — R1031 Right lower quadrant pain: Secondary | ICD-10-CM | POA: Insufficient documentation

## 2013-11-04 LAB — URINE CULTURE

## 2013-11-04 MED ORDER — ONDANSETRON 4 MG PO TBDP
4.0000 mg | ORAL_TABLET | Freq: Once | ORAL | Status: AC
  Administered 2013-11-04: 4 mg via ORAL

## 2013-11-04 MED ORDER — ONDANSETRON 4 MG PO TBDP
ORAL_TABLET | ORAL | Status: AC
  Filled 2013-11-04: qty 1

## 2013-11-04 NOTE — ED Provider Notes (Signed)
Medical screening examination/treatment/procedure(s) were performed by non-physician practitioner and as supervising physician I was immediately available for consultation/collaboration.  EKG Interpretation   None         [name], MD 11/04/13 [phone]

## 2013-11-04 NOTE — ED Provider Notes (Signed)
Patient seen on 11/03/13 by Dr. Fredderick Phenix and returns 11/04/13 for pelvic US. Impression: small fibroids x 2. Phones patient at [phone] to give results. She is aware she has fibroids. She has a GYN with [facility] and will follow up with them.   Arnoldo Hooker, PA-C 11/04/13 [phone]

## 2013-11-05 LAB — GC/CHLAMYDIA PROBE AMP
CT Probe RNA: NEGATIVE
GC Probe RNA: NEGATIVE

## 2014-01-03 ENCOUNTER — Emergency Department (HOSPITAL_BASED_OUTPATIENT_CLINIC_OR_DEPARTMENT_OTHER)
Admission: EM | Admit: 2014-01-03 | Discharge: 2014-01-04 | Disposition: A | Discharge status: Home / Self Care | Payer: Medicaid Other | Attending: Emergency Medicine | Admitting: Emergency Medicine

## 2014-01-03 ENCOUNTER — Encounter (HOSPITAL_BASED_OUTPATIENT_CLINIC_OR_DEPARTMENT_OTHER): Payer: Self-pay | Admitting: Emergency Medicine

## 2014-01-03 ENCOUNTER — Emergency Department (HOSPITAL_BASED_OUTPATIENT_CLINIC_OR_DEPARTMENT_OTHER): Payer: Medicaid Other

## 2014-01-03 DIAGNOSIS — IMO0001 Reserved for inherently not codable concepts without codable children: Secondary | ICD-10-CM | POA: Insufficient documentation

## 2014-01-03 DIAGNOSIS — Y929 Unspecified place or not applicable: Secondary | ICD-10-CM | POA: Insufficient documentation

## 2014-01-03 DIAGNOSIS — T148XXA Other injury of unspecified body region, initial encounter: Secondary | ICD-10-CM

## 2014-01-03 DIAGNOSIS — Z862 Personal history of diseases of the blood and blood-forming organs and certain disorders involving the immune mechanism: Secondary | ICD-10-CM | POA: Insufficient documentation

## 2014-01-03 DIAGNOSIS — Y939 Activity, unspecified: Secondary | ICD-10-CM | POA: Insufficient documentation

## 2014-01-03 DIAGNOSIS — R350 Frequency of micturition: Secondary | ICD-10-CM | POA: Insufficient documentation

## 2014-01-03 DIAGNOSIS — M129 Arthropathy, unspecified: Secondary | ICD-10-CM | POA: Insufficient documentation

## 2014-01-03 DIAGNOSIS — Z792 Long term (current) use of antibiotics: Secondary | ICD-10-CM | POA: Insufficient documentation

## 2014-01-03 DIAGNOSIS — R110 Nausea: Secondary | ICD-10-CM | POA: Insufficient documentation

## 2014-01-03 DIAGNOSIS — J029 Acute pharyngitis, unspecified: Secondary | ICD-10-CM | POA: Insufficient documentation

## 2014-01-03 DIAGNOSIS — M5380 Other specified dorsopathies, site unspecified: Secondary | ICD-10-CM | POA: Insufficient documentation

## 2014-01-03 DIAGNOSIS — Z79899 Other long term (current) drug therapy: Secondary | ICD-10-CM | POA: Insufficient documentation

## 2014-01-03 DIAGNOSIS — H9209 Otalgia, unspecified ear: Secondary | ICD-10-CM | POA: Insufficient documentation

## 2014-01-03 DIAGNOSIS — Z3202 Encounter for pregnancy test, result negative: Secondary | ICD-10-CM | POA: Insufficient documentation

## 2014-01-03 DIAGNOSIS — S239XXA Sprain of unspecified parts of thorax, initial encounter: Secondary | ICD-10-CM | POA: Insufficient documentation

## 2014-01-03 DIAGNOSIS — F410 Panic disorder [episodic paroxysmal anxiety] without agoraphobia: Secondary | ICD-10-CM | POA: Insufficient documentation

## 2014-01-03 DIAGNOSIS — R079 Chest pain, unspecified: Secondary | ICD-10-CM | POA: Insufficient documentation

## 2014-01-03 DIAGNOSIS — X58XXXA Exposure to other specified factors, initial encounter: Secondary | ICD-10-CM | POA: Insufficient documentation

## 2014-01-03 HISTORY — DX: Panic disorder (episodic paroxysmal anxiety): F41.0

## 2014-01-03 NOTE — ED Notes (Signed)
Pt reports onset of mid back accompanied with generalized fatique and malaise also report occasional frequent urination denies dysuria also report increased pain with deep breathing

## 2014-01-03 NOTE — ED Provider Notes (Signed)
CSN: [id number]     Arrival date & time 01/03/14  2148 History   First MD Initiated Contact with Patient 01/03/14 2238     Chief Complaint  Patient presents with  . Back Pain   (Consider location/radiation/quality/duration/timing/severity/associated sxs/prior Treatment) Patient is a 43 y.o. female presenting with back pain. The history is provided by the patient.  Back Pain Location:  Thoracic spine Quality:  Aching Radiates to:  Does not radiate Pain severity:  Severe Pain is:  Same all the time Progression:  Worsening Chronicity:  New Relieved by:  Nothing Worsened by:  Ambulation, movement, twisting and bending Ineffective treatments:  Ibuprofen Associated symptoms: chest pain (soreness with movement)   Associated symptoms: no abdominal pain, no bladder incontinence, no bowel incontinence, no dysuria, no fever, no leg pain, no numbness and no weakness    Stefanie Wade is a 43 y.o. female who presents to the ED with back pain, frequent urination and aching. She took ibuprofen once yesterday but it did not help so she hasn't taken any more. Patient is sexually active without birth control because every method she used caused her problems. Medical history is significant for Fibromyalgia, arthritis, anemia and panic attacks.   Past Medical History  Diagnosis Date  . Fibromyalgia   . Arthritis   . Anemia   . Panic attack    Past Surgical History  Procedure Laterality Date  . Cesarean section    . Foot surgery     Family History  Problem Relation Age of Onset  . Diabetes Mother   . Hypertension Mother   . Diabetes Sister   . Diabetes Maternal Grandmother   . Hypertension Maternal Grandmother   . Diabetes Paternal Grandmother   . Hypertension Paternal Grandmother   . Heart attack Neg Hx   . Hyperlipidemia Neg Hx   . Sudden death Neg Hx    History  Substance Use Topics  . Smoking status: Never Smoker   . Smokeless tobacco: Not on file  . Alcohol Use: No   OB  History   Grav Para Term Preterm Abortions TAB SAB Ect Mult Living                 Review of Systems  Constitutional: Negative for fever and chills.  HENT: Positive for congestion, ear pain and sore throat.   Respiratory: Negative for cough.   Cardiovascular: Positive for chest pain (soreness with movement).  Gastrointestinal: Positive for nausea. Negative for vomiting, abdominal pain and bowel incontinence.  Genitourinary: Positive for frequency. Negative for bladder incontinence, dysuria and urgency.  Musculoskeletal: Positive for back pain.  Skin: Negative for rash.  Neurological: Negative for weakness and numbness.  Psychiatric/Behavioral: Negative for confusion. The patient is not nervous/anxious.     Allergies  Other and Percocet  Home Medications   Current Outpatient Rx  Name  Route  Sig  Dispense  Refill  . amoxicillin (AMOXIL) 500 MG capsule   Oral   Take 1 capsule (500 mg total) by mouth 3 (three) times daily.   21 capsule   0   . Butalbital-APAP-Caffeine (FIORICET PO)   Oral   Take by mouth.         . ciprofloxacin (CIPRO) 500 MG tablet   Oral   Take 1 tablet (500 mg total) by mouth 2 (two) times daily. One po bid x 7 days   14 tablet   0   . EXPIRED: famotidine (PEPCID) 20 MG tablet   Oral   Take  1 tablet (20 mg total) by mouth 2 (two) times daily.   30 tablet   0   . FLUoxetine (PROZAC) 20 MG capsule   Oral   Take 20 mg by mouth daily.         Marland Kitchen gabapentin (NEURONTIN) 300 MG capsule   Oral   Take 300 mg by mouth 3 (three) times daily. Pt states that she only takes when she feels pain         . HYDROcodone-acetaminophen (NORCO/VICODIN) 5-325 MG per tablet   Oral   Take 1 tablet by mouth every 6 (six) hours as needed for pain.         Marland Kitchen HYDROcodone-acetaminophen (NORCO/VICODIN) 5-325 MG per tablet   Oral   Take 2 tablets by mouth every 4 (four) hours as needed.   15 tablet   0   . levonorgestrel-ethinyl estradiol (NORDETTE) 0.15-30  MG-MCG tablet   Oral   Take 1 tablet by mouth daily.         . metroNIDAZOLE (FLAGYL) 500 MG tablet   Oral   Take 1 tablet (500 mg total) by mouth 2 (two) times daily. One po bid x 7 days   14 tablet   0   . Nutritional Supplements (COLD AND FLU PO)   Oral   Take 2 tablets by mouth once.           . ondansetron (ZOFRAN ODT) 4 MG disintegrating tablet   Oral   Take 1 tablet (4 mg total) by mouth every 8 (eight) hours as needed for nausea.   10 tablet   0   . EXPIRED: promethazine (PHENERGAN) 25 MG tablet   Oral   Take 1 tablet (25 mg total) by mouth every 6 (six) hours as needed for nausea.   12 tablet   0   . traMADol (ULTRAM) 50 MG tablet      Take 1-2 tabs by mouth every 6 hours when necessary pain.   30 tablet   0   . traMADol (ULTRAM) 50 MG tablet   Oral   Take 1 tablet (50 mg total) by mouth every 6 (six) hours as needed for pain.   15 tablet   0   . traMADol (ULTRAM) 50 MG tablet   Oral   Take 1 tablet (50 mg total) by mouth every 6 (six) hours as needed for pain.   15 tablet   0    BP 93/60  Pulse 78  Temp(Src) 99.2 F (37.3 C) (Oral)  Resp 18  Wt 140 lb (63.504 kg)  SpO2 100%  LMP 12/03/2013 Physical Exam  Nursing note and vitals reviewed. Constitutional: She is oriented to person, place, and time. She appears well-developed and well-nourished. No distress.  HENT:  Head: Normocephalic and atraumatic.  Eyes: Conjunctivae and EOM are normal.  Neck: Normal range of motion. Neck supple.  Cardiovascular: Normal rate and regular rhythm.   Pulmonary/Chest: Effort normal. She has no wheezes. She has no rales.  Abdominal: Soft. Bowel sounds are normal. There is no tenderness.  Musculoskeletal:       Lumbar back: She exhibits decreased range of motion, tenderness and spasm.       Back:  Tender left lumbar area with palpation and range of motion. Pedal pulses equal bilateral. Pain with straight leg raises on the left.   Neurological: She is alert  and oriented to person, place, and time. She has normal strength and normal reflexes. No cranial nerve deficit or sensory  deficit. Coordination and gait normal.  Skin: Skin is warm and dry.  Psychiatric: She has a normal mood and affect. Her behavior is normal.    ED Course: labs, x-ray   Procedures   MDM  Care turned over to Dr. Randal Buba @ 0100. Labs and x-rays pending.     [facility] Bunnie Pion, [place] 01/04/14 [phone]

## 2014-01-04 LAB — URINE MICROSCOPIC-ADD ON

## 2014-01-04 LAB — CBC WITH DIFFERENTIAL/PLATELET
Basophils Absolute: 0 10*3/uL (ref 0.0–0.1)
Basophils Relative: 0 % (ref 0–1)
EOS ABS: 0.1 10*3/uL (ref 0.0–0.7)
Eosinophils Relative: 1 % (ref 0–5)
HEMATOCRIT: 32.9 % — AB (ref 36.0–46.0)
HEMOGLOBIN: 10.6 g/dL — AB (ref 12.0–15.0)
Lymphocytes Relative: 51 % — ABNORMAL HIGH (ref 12–46)
Lymphs Abs: 2.5 10*3/uL (ref 0.7–4.0)
MCH: 30.6 pg (ref 26.0–34.0)
MCHC: 32.2 g/dL (ref 30.0–36.0)
MCV: 95.1 fL (ref 78.0–100.0)
MONO ABS: 0.5 10*3/uL (ref 0.1–1.0)
MONOS PCT: 11 % (ref 3–12)
NEUTROS ABS: 1.7 10*3/uL (ref 1.7–7.7)
Neutrophils Relative %: 36 % — ABNORMAL LOW (ref 43–77)
Platelets: 234 10*3/uL (ref 150–400)
RBC: 3.46 MIL/uL — AB (ref 3.87–5.11)
RDW: 12.3 % (ref 11.5–15.5)
WBC: 4.8 10*3/uL (ref 4.0–10.5)

## 2014-01-04 LAB — COMPREHENSIVE METABOLIC PANEL
ALK PHOS: 43 U/L (ref 39–117)
ALT: <5 U/L (ref 0–35)
AST: 13 U/L (ref 0–37)
Albumin: 3.5 g/dL (ref 3.5–5.2)
BILIRUBIN TOTAL: 0.4 mg/dL (ref 0.3–1.2)
BUN: 12 mg/dL (ref 6–23)
CHLORIDE: 104 meq/L (ref 96–112)
CO2: 24 mEq/L (ref 19–32)
Calcium: 8.5 mg/dL (ref 8.4–10.5)
Creatinine, Ser: 1 mg/dL (ref 0.50–1.10)
GFR calc Af Amer: 80 mL/min — ABNORMAL LOW (ref >90)
GFR calc non Af Amer: 69 mL/min — ABNORMAL LOW (ref >90)
Glucose, Bld: 101 mg/dL — ABNORMAL HIGH (ref 70–99)
POTASSIUM: 3.9 meq/L (ref 3.7–5.3)
Sodium: 140 mEq/L (ref 137–147)
Total Protein: 6.4 g/dL (ref 6.0–8.3)

## 2014-01-04 LAB — TROPONIN I: Troponin I: <0.3 ng/mL (ref <0.30)

## 2014-01-04 LAB — URINALYSIS, ROUTINE W REFLEX MICROSCOPIC
Bilirubin Urine: NEGATIVE
Glucose, UA: NEGATIVE mg/dL
KETONES UR: 15 mg/dL — AB
LEUKOCYTES UA: NEGATIVE
Nitrite: NEGATIVE
PH: 5 (ref 5.0–8.0)
PROTEIN: NEGATIVE mg/dL
Specific Gravity, Urine: 1.022 (ref 1.005–1.030)
Urobilinogen, UA: 0.2 mg/dL (ref 0.0–1.0)

## 2014-01-04 LAB — PREGNANCY, URINE: Preg Test, Ur: NEGATIVE

## 2014-01-04 LAB — D-DIMER, QUANTITATIVE: D-Dimer, Quant: <0.27 ug/mL-FEU (ref 0.00–0.48)

## 2014-01-04 MED ORDER — MELOXICAM 7.5 MG PO TABS: 7.5000 mg | ORAL_TABLET | Freq: Every day | ORAL | Status: DC

## 2014-01-04 MED ORDER — METHOCARBAMOL 500 MG PO TABS: 500.0000 mg | ORAL_TABLET | Freq: Two times a day (BID) | ORAL | Status: DC

## 2014-01-04 MED ORDER — METHOCARBAMOL 500 MG PO TABS
1000.0000 mg | ORAL_TABLET | Freq: Once | ORAL | Status: AC
  Administered 2014-01-04: 1000 mg via ORAL
  Filled 2014-01-04: qty 2

## 2014-01-04 MED ORDER — KETOROLAC TROMETHAMINE 30 MG/ML IJ SOLN
30.0000 mg | Freq: Once | INTRAMUSCULAR | Status: AC
  Administered 2014-01-04: 30 mg via INTRAVENOUS
  Filled 2014-01-04: qty 1

## 2014-01-04 MED ORDER — TRAMADOL HCL 50 MG PO TABS: 50.0000 mg | ORAL_TABLET | Freq: Four times a day (QID) | ORAL | Status: DC | PRN

## 2014-01-04 NOTE — ED Notes (Signed)
After getting ECG and vitals, I placed patient on the wall monitor/ five lead.

## 2014-01-04 NOTE — ED Provider Notes (Signed)
Date: 01/04/2014  Rate: 67  Rhythm: normal sinus rhythm  QRS Axis: normal  Intervals: normal  ST/T Wave abnormalities: normal  Conduction Disutrbances: none  Narrative Interpretation: unremarkable   Medical screening examination/treatment/procedure(s) were performed by non-physician practitioner and as supervising physician I was immediately available for consultation/collaboration.  EKG Interpretation    Date/Time:  Monday January 04 2014 01:16:01 EST Ventricular Rate:  67 PR Interval:  188 QRS Duration: 70 QT Interval:  376 QTC Calculation: 397 R Axis:   40 Text Interpretation:  Normal sinus rhythm No significant change since last tracing Confirmed by [facility]  MD, Griffey Nicasio ([id number]) on 01/04/2014 2:23:19 AM              Stefanie Elmore Alfonso Patten, MD 01/04/14 [id number]

## 2014-08-17 ENCOUNTER — Emergency Department (HOSPITAL_COMMUNITY): Payer: Medicaid Other

## 2014-08-17 ENCOUNTER — Emergency Department (HOSPITAL_COMMUNITY)
Admission: EM | Admit: 2014-08-17 | Discharge: 2014-08-17 | Disposition: A | Discharge status: Home / Self Care | Payer: Medicaid Other | Attending: Emergency Medicine | Admitting: Emergency Medicine

## 2014-08-17 ENCOUNTER — Encounter (HOSPITAL_COMMUNITY): Payer: Self-pay | Admitting: Emergency Medicine

## 2014-08-17 DIAGNOSIS — Z3202 Encounter for pregnancy test, result negative: Secondary | ICD-10-CM | POA: Diagnosis not present

## 2014-08-17 DIAGNOSIS — Z79899 Other long term (current) drug therapy: Secondary | ICD-10-CM | POA: Insufficient documentation

## 2014-08-17 DIAGNOSIS — Z792 Long term (current) use of antibiotics: Secondary | ICD-10-CM | POA: Insufficient documentation

## 2014-08-17 DIAGNOSIS — Z791 Long term (current) use of non-steroidal anti-inflammatories (NSAID): Secondary | ICD-10-CM | POA: Insufficient documentation

## 2014-08-17 DIAGNOSIS — Z9889 Other specified postprocedural states: Secondary | ICD-10-CM | POA: Insufficient documentation

## 2014-08-17 DIAGNOSIS — N12 Tubulo-interstitial nephritis, not specified as acute or chronic: Secondary | ICD-10-CM | POA: Diagnosis not present

## 2014-08-17 DIAGNOSIS — F410 Panic disorder [episodic paroxysmal anxiety] without agoraphobia: Secondary | ICD-10-CM | POA: Insufficient documentation

## 2014-08-17 DIAGNOSIS — R109 Unspecified abdominal pain: Secondary | ICD-10-CM | POA: Diagnosis present

## 2014-08-17 DIAGNOSIS — M129 Arthropathy, unspecified: Secondary | ICD-10-CM | POA: Insufficient documentation

## 2014-08-17 DIAGNOSIS — Z862 Personal history of diseases of the blood and blood-forming organs and certain disorders involving the immune mechanism: Secondary | ICD-10-CM | POA: Insufficient documentation

## 2014-08-17 LAB — BASIC METABOLIC PANEL
Anion gap: 10 (ref 5–15)
BUN: 10 mg/dL (ref 6–23)
CHLORIDE: 105 meq/L (ref 96–112)
CO2: 25 mEq/L (ref 19–32)
CREATININE: 0.87 mg/dL (ref 0.50–1.10)
Calcium: 8.5 mg/dL (ref 8.4–10.5)
GFR calc Af Amer: >90 mL/min (ref >90)
GFR calc non Af Amer: 82 mL/min — ABNORMAL LOW (ref >90)
GLUCOSE: 95 mg/dL (ref 70–99)
POTASSIUM: 4 meq/L (ref 3.7–5.3)
Sodium: 140 mEq/L (ref 137–147)

## 2014-08-17 LAB — URINE MICROSCOPIC-ADD ON

## 2014-08-17 LAB — CBC WITH DIFFERENTIAL/PLATELET
Basophils Absolute: 0 10*3/uL (ref 0.0–0.1)
Basophils Relative: 1 % (ref 0–1)
Eosinophils Absolute: 0 10*3/uL (ref 0.0–0.7)
Eosinophils Relative: 1 % (ref 0–5)
HCT: 33.5 % — ABNORMAL LOW (ref 36.0–46.0)
HEMOGLOBIN: 11 g/dL — AB (ref 12.0–15.0)
LYMPHS ABS: 2.1 10*3/uL (ref 0.7–4.0)
Lymphocytes Relative: 51 % — ABNORMAL HIGH (ref 12–46)
MCH: 30.5 pg (ref 26.0–34.0)
MCHC: 32.8 g/dL (ref 30.0–36.0)
MCV: 92.8 fL (ref 78.0–100.0)
MONO ABS: 0.3 10*3/uL (ref 0.1–1.0)
Monocytes Relative: 8 % (ref 3–12)
NEUTROS PCT: 41 % — AB (ref 43–77)
Neutro Abs: 1.7 10*3/uL (ref 1.7–7.7)
Platelets: 268 10*3/uL (ref 150–400)
RBC: 3.61 MIL/uL — AB (ref 3.87–5.11)
RDW: 12.5 % (ref 11.5–15.5)
WBC: 4.1 10*3/uL (ref 4.0–10.5)

## 2014-08-17 LAB — URINALYSIS, ROUTINE W REFLEX MICROSCOPIC
Bilirubin Urine: NEGATIVE
GLUCOSE, UA: NEGATIVE mg/dL
Ketones, ur: NEGATIVE mg/dL
Nitrite: NEGATIVE
PROTEIN: NEGATIVE mg/dL
Specific Gravity, Urine: 1.009 (ref 1.005–1.030)
UROBILINOGEN UA: 1 mg/dL (ref 0.0–1.0)
pH: 6 (ref 5.0–8.0)

## 2014-08-17 LAB — POC URINE PREG, ED: PREG TEST UR: NEGATIVE

## 2014-08-17 MED ORDER — CIPROFLOXACIN HCL 500 MG PO TABS: 500.0000 mg | ORAL_TABLET | Freq: Two times a day (BID) | ORAL | Status: DC

## 2014-08-17 MED ORDER — ONDANSETRON 4 MG PO TBDP
4.0000 mg | ORAL_TABLET | Freq: Three times a day (TID) | ORAL | Status: DC | PRN
Start: 2014-08-17 — End: 2016-06-18

## 2014-08-17 MED ORDER — ONDANSETRON 4 MG PO TBDP
8.0000 mg | ORAL_TABLET | Freq: Once | ORAL | Status: AC
  Administered 2014-08-17: 8 mg via ORAL
  Filled 2014-08-17: qty 2

## 2014-08-17 MED ORDER — HYDROCODONE-ACETAMINOPHEN 5-325 MG PO TABS: 1.0000 | ORAL_TABLET | Freq: Four times a day (QID) | ORAL | Status: DC | PRN

## 2014-08-17 MED ORDER — MORPHINE SULFATE 4 MG/ML IJ SOLN
4.0000 mg | Freq: Once | INTRAMUSCULAR | Status: AC
  Administered 2014-08-17: 4 mg via INTRAVENOUS
  Filled 2014-08-17: qty 1

## 2014-08-17 MED ORDER — ONDANSETRON HCL 4 MG/2ML IJ SOLN
4.0000 mg | Freq: Once | INTRAMUSCULAR | Status: AC
  Administered 2014-08-17: 4 mg via INTRAVENOUS

## 2014-08-17 MED ORDER — DEXTROSE 5 % IV SOLN
1.0000 g | Freq: Once | INTRAVENOUS | Status: AC
  Administered 2014-08-17: 1 g via INTRAVENOUS
  Filled 2014-08-17: qty 10

## 2014-08-17 MED ORDER — OXYCODONE-ACETAMINOPHEN 5-325 MG PO TABS: 1.0000 | ORAL_TABLET | Freq: Once | ORAL | Status: DC

## 2014-08-17 NOTE — Discharge Instructions (Signed)
Please follow up with your primary care physician in 1-2 days. If you do not have one please call the [facility] number listed above. Please take your antibiotic until completion. Please take pain medication and/or muscle relaxants as prescribed and as needed for pain. Please do not drive on narcotic pain medication or on muscle relaxants. Please read all discharge instructions and return precautions.  ° ° °Pyelonephritis, Adult °Pyelonephritis is a kidney infection. In general, there are 2 main types of pyelonephritis: °· Infections that come on quickly without any warning (acute pyelonephritis). °· Infections that persist for a long period of time (chronic pyelonephritis). °CAUSES  °Two main causes of pyelonephritis are: °· Bacteria traveling from the bladder to the kidney. This is a problem especially in pregnant women. The urine in the bladder can become filled with bacteria from multiple causes, including: °¨ Inflammation of the prostate gland (prostatitis). °¨ Sexual intercourse in females. °¨ Bladder infection (cystitis). °· Bacteria traveling from the bloodstream to the tissue part of the kidney. °Problems that may increase your risk of getting a kidney infection include: °· Diabetes. °· Kidney stones or bladder stones. °· Cancer. °· Catheters placed in the bladder. °· Other abnormalities of the kidney or ureter. °SYMPTOMS  °· Abdominal pain. °· Pain in the side or flank area. °· Fever. °· Chills. °· Upset stomach. °· Blood in the urine (dark urine). °· Frequent urination. °· Strong or persistent urge to urinate. °· Burning or stinging when urinating. °DIAGNOSIS  °Your caregiver may diagnose your kidney infection based on your symptoms. A urine sample may also be taken. °TREATMENT  °In general, treatment depends on how severe the infection is.  °· If the infection is mild and caught early, your caregiver may treat you with oral antibiotics and send you home. °· If the infection is more  severe, the bacteria may have gotten into the bloodstream. This will require intravenous (IV) antibiotics and a hospital stay. Symptoms may include: °¨ High fever. °¨ Severe flank pain. °¨ Shaking chills. °· Even after a hospital stay, your caregiver may require you to be on oral antibiotics for a period of time. °· Other treatments may be required depending upon the cause of the infection. °HOME CARE INSTRUCTIONS  °· Take your antibiotics as directed. Finish them even if you start to feel better. °· Make an appointment to have your urine checked to make sure the infection is gone. °· Drink enough fluids to keep your urine clear or pale yellow. °· Take medicines for the bladder if you have urgency and frequency of urination as directed by your caregiver. °SEEK IMMEDIATE MEDICAL CARE IF:  °· You have a fever or persistent symptoms for more than 2-3 days. °· You have a fever and your symptoms suddenly get worse. °· You are unable to take your antibiotics or fluids. °· You develop shaking chills. °· You experience extreme weakness or fainting. °· There is no improvement after 2 days of treatment. °MAKE SURE YOU: °· Understand these instructions. °· Will watch your condition. °· Will get help right away if you are not doing well or get worse. °Document Released: 12/03/2005 Document Revised: 06/03/2012 Document Reviewed: 05/09/2011 °[employer]® Patient Information ©2015 [employer]. This information is not intended to replace advice given to you by your health care provider. Make sure you discuss any questions you have with your health care provider. ° °

## 2014-08-17 NOTE — ED Notes (Signed)
"  feel better", reports some nausea, IV site U. Alert, NAD, calm.

## 2014-08-17 NOTE — ED Notes (Signed)
Back from CT, lab at [facility], no changes, IV site remains unremarkable, abx infusing via pump.

## 2014-08-17 NOTE — ED Provider Notes (Signed)
CSN: [id number]     Arrival date & time 08/17/14  1545 History   First MD Initiated Contact with Patient 08/17/14 1903     Chief Complaint  Patient presents with  . Flank Pain     (Consider location/radiation/quality/duration/timing/severity/associated sxs/prior Treatment) HPI Comments: Patient is a 43 yo F PMHx significant for fibromyalgia, arthritis, anemia presenting to the ED from PCP for evaluation of UTI. Patient states she has had two weeks of suprapubic pressure with radiation to right and left flanks. Patient endorses associated frequency and urgency, nausea. Patient denies any fever, chills, emesis, hematuria. LMP last week. Abdominal surgical history includes C-section.    Past Medical History  Diagnosis Date  . Fibromyalgia   . Arthritis   . Anemia   . Panic attack    Past Surgical History  Procedure Laterality Date  . Cesarean section    . Foot surgery     Family History  Problem Relation Age of Onset  . Diabetes Mother   . Hypertension Mother   . Diabetes Sister   . Diabetes Maternal Grandmother   . Hypertension Maternal Grandmother   . Diabetes Paternal Grandmother   . Hypertension Paternal Grandmother   . Heart attack Neg Hx   . Hyperlipidemia Neg Hx   . Sudden death Neg Hx    History  Substance Use Topics  . Smoking status: Never Smoker   . Smokeless tobacco: Not on file  . Alcohol Use: No   OB History   Grav Para Term Preterm Abortions TAB SAB Ect Mult Living                 Review of Systems  Constitutional: Negative for fever and chills.  Gastrointestinal: Positive for nausea and abdominal pain. Negative for vomiting, diarrhea and constipation.  Genitourinary: Positive for urgency, frequency, flank pain and decreased urine volume. Negative for hematuria, vaginal bleeding and vaginal discharge.  All other systems reviewed and are negative.     Allergies  Other and Percocet  Home Medications   Prior to Admission medications   Medication  Sig Start Date End Date Taking? Authorizing Provider  butalbital-acetaminophen-caffeine (FIORICET, ESGIC) 50-325-40 MG per tablet Take 1 tablet by mouth 2 (two) times daily as needed for migraine.   Yes Historical Provider, MD  FLUoxetine (PROZAC) 20 MG capsule Take 20 mg by mouth daily.   Yes Historical Provider, MD  gabapentin (NEURONTIN) 300 MG capsule Take 300 mg by mouth 3 (three) times daily. Pt states that she only takes when she feels pain   Yes Historical Provider, MD  levonorgestrel-ethinyl estradiol (NORDETTE) 0.15-30 MG-MCG tablet Take 1 tablet by mouth daily.   Yes Historical Provider, MD  meloxicam (MOBIC) 7.5 MG tablet Take 7.5 mg by mouth daily.   Yes Historical Provider, MD  ciprofloxacin (CIPRO) 500 MG tablet Take 1 tablet (500 mg total) by mouth every 12 (twelve) hours. 08/17/14   Myishia Kasik L Annali Lybrand, PA-C  HYDROcodone-acetaminophen (NORCO/VICODIN) 5-325 MG per tablet Take 1 tablet by mouth every 6 (six) hours as needed for severe pain. 08/17/14   Lakeyia Surber L Jackson Fetters, PA-C  ondansetron (ZOFRAN ODT) 4 MG disintegrating tablet Take 1 tablet (4 mg total) by mouth every 8 (eight) hours as needed for nausea or vomiting. 08/17/14   Marlisha Vanwyk L Evea Sheek, PA-C   BP 104/65  Pulse 63  Temp(Src) 98.4 F (36.9 C) (Oral)  Resp 12  SpO2 99%  LMP 08/16/2014 Physical Exam  Nursing note and vitals reviewed. Constitutional: She is oriented to  person, place, and time. She appears well-developed and well-nourished. No distress.  HENT:  Head: Normocephalic and atraumatic.  Right Ear: External ear normal.  Left Ear: External ear normal.  Nose: Nose normal.  Mouth/Throat: Oropharynx is clear and moist.  Eyes: Conjunctivae are normal.  Neck: Neck supple.  Cardiovascular: Normal rate, regular rhythm and normal heart sounds.   Pulmonary/Chest: Effort normal and breath sounds normal. No respiratory distress.  Abdominal: Soft. Bowel sounds are normal. She exhibits no distension. There is  tenderness in the suprapubic area. There is CVA tenderness (left). There is no rigidity, no rebound and no guarding.  Neurological: She is alert and oriented to person, place, and time.  Moves all extremities without ataxia.   Skin: Skin is warm and dry. She is not diaphoretic.    ED Course  Procedures (including critical care time) Medications  oxyCODONE-acetaminophen (PERCOCET/ROXICET) 5-325 MG per tablet 1 tablet (1 tablet Oral Not Given 08/17/14 2153)  ondansetron (ZOFRAN-ODT) disintegrating tablet 8 mg (8 mg Oral Given 08/17/14 1756)  morphine 4 MG/ML injection 4 mg (4 mg Intravenous Given 08/17/14 1945)  cefTRIAXone (ROCEPHIN) 1 g in dextrose 5 % 50 mL IVPB (0 g Intravenous Stopped 08/17/14 2153)  ondansetron (ZOFRAN) injection 4 mg (4 mg Intravenous Given 08/17/14 1944)    Labs Review Labs Reviewed  URINALYSIS, ROUTINE W REFLEX MICROSCOPIC - Abnormal; Notable for the following:    APPearance CLOUDY (*)    Hgb urine dipstick MODERATE (*)    Leukocytes, UA MODERATE (*)    All other components within normal limits  URINE MICROSCOPIC-ADD ON - Abnormal; Notable for the following:    Squamous Epithelial / LPF MANY (*)    Bacteria, UA FEW (*)    All other components within normal limits  CBC WITH DIFFERENTIAL - Abnormal; Notable for the following:    RBC 3.61 (*)    Hemoglobin 11.0 (*)    HCT 33.5 (*)    Neutrophils Relative % 41 (*)    Lymphocytes Relative 51 (*)    All other components within normal limits  BASIC METABOLIC PANEL - Abnormal; Notable for the following:    GFR calc non Af Amer 82 (*)    All other components within normal limits  POC URINE PREG, ED    Imaging Review Ct Abdomen Pelvis Wo Contrast  08/17/2014   CLINICAL DATA:  FLANK PAIN  EXAM: CT ABDOMEN AND PELVIS WITHOUT CONTRAST  TECHNIQUE: Multidetector CT imaging of the abdomen and pelvis was performed following the standard protocol without IV contrast.  COMPARISON:  11/03/2013  FINDINGS: Visualized lung bases  clear. Unremarkable liver, gallbladder, spleen, adrenal glands, kidneys, pancreas, aorta. No nephrolithiasis or hydronephrosis. Unenhanced CT was performed per clinician order. Lack of IV contrast limits sensitivity and specificity, especially for evaluation of abdominal/pelvic solid viscera. Stomach, small bowel, and colon are nondilated. Normal appendix. Uterus and adnexal regions unremarkable. Urinary bladder incompletely distended. Left pelvic phleboliths. No ascites. No free air. No adenopathy localized. Regional bones unremarkable.  IMPRESSION: 1. No acute abnormality.   Electronically Signed   By: Arne Cleveland M.D.   On: 08/17/2014 20:24     EKG Interpretation None      MDM   Final diagnoses:  Pyelonephritis    Filed Vitals:   08/17/14 2130  BP: 104/65  Pulse: 63  Temp:   Resp: 12   Afebrile, NAD, non-toxic appearing, AAOx4. Pt is afebrile, normotensive, and denies any emesis. Abdomen is soft, non-tender, non-distended. CVA tenderness present. UA reviewed.  CT scan reviewed. Dose of IV Rocephin given in ED.  Pt to be dc home with antibiotics and instructions to follow up with PCP if symptoms persist. Return precautions discussed. Patient is agreeable to plan. Patient is stable at time of discharge         Harlow Mares, PA-C 08/18/14 0008

## 2014-08-17 NOTE — ED Notes (Signed)
Pt sent by PCP for UTI. Pt reports bilateral flank pain that radiates to groin. Pt reports urinary frequency and pressure. Denies hematuria. Pt in NAD. AO x4. Denies fever/chills.

## 2014-08-18 NOTE — ED Provider Notes (Signed)
  Medical screening examination/treatment/procedure(s) were performed by non-physician practitioner and as supervising physician I was immediately available for consultation/collaboration.      Carmin Muskrat, MD 08/18/14 [phone]

## 2014-10-18 ENCOUNTER — Other Ambulatory Visit: Payer: Self-pay | Admitting: Internal Medicine

## 2014-10-18 DIAGNOSIS — Z1231 Encounter for screening mammogram for malignant neoplasm of breast: Secondary | ICD-10-CM

## 2014-11-16 ENCOUNTER — Ambulatory Visit: Payer: Medicaid Other

## 2014-12-13 ENCOUNTER — Ambulatory Visit
Admission: RE | Admit: 2014-12-13 | Discharge: 2014-12-13 | Disposition: A | Discharge status: Home / Self Care | Payer: Medicaid Other | Source: Ambulatory Visit | Attending: Internal Medicine | Admitting: Internal Medicine

## 2014-12-13 ENCOUNTER — Encounter (INDEPENDENT_AMBULATORY_CARE_PROVIDER_SITE_OTHER): Payer: Self-pay

## 2014-12-13 DIAGNOSIS — Z1231 Encounter for screening mammogram for malignant neoplasm of breast: Secondary | ICD-10-CM

## 2015-07-17 ENCOUNTER — Encounter (HOSPITAL_BASED_OUTPATIENT_CLINIC_OR_DEPARTMENT_OTHER): Payer: Self-pay | Admitting: *Deleted

## 2015-07-17 ENCOUNTER — Emergency Department (HOSPITAL_BASED_OUTPATIENT_CLINIC_OR_DEPARTMENT_OTHER)
Admission: EM | Admit: 2015-07-17 | Discharge: 2015-07-18 | Disposition: A | Discharge status: Home / Self Care | Payer: Medicaid Other | Attending: Emergency Medicine | Admitting: Emergency Medicine

## 2015-07-17 DIAGNOSIS — L03114 Cellulitis of left upper limb: Secondary | ICD-10-CM | POA: Insufficient documentation

## 2015-07-17 DIAGNOSIS — Z792 Long term (current) use of antibiotics: Secondary | ICD-10-CM | POA: Diagnosis not present

## 2015-07-17 DIAGNOSIS — M1990 Unspecified osteoarthritis, unspecified site: Secondary | ICD-10-CM | POA: Diagnosis not present

## 2015-07-17 DIAGNOSIS — Y939 Activity, unspecified: Secondary | ICD-10-CM | POA: Diagnosis not present

## 2015-07-17 DIAGNOSIS — F410 Panic disorder [episodic paroxysmal anxiety] without agoraphobia: Secondary | ICD-10-CM | POA: Diagnosis not present

## 2015-07-17 DIAGNOSIS — M797 Fibromyalgia: Secondary | ICD-10-CM | POA: Diagnosis not present

## 2015-07-17 DIAGNOSIS — Y999 Unspecified external cause status: Secondary | ICD-10-CM | POA: Diagnosis not present

## 2015-07-17 DIAGNOSIS — Z862 Personal history of diseases of the blood and blood-forming organs and certain disorders involving the immune mechanism: Secondary | ICD-10-CM | POA: Insufficient documentation

## 2015-07-17 DIAGNOSIS — T63484A Toxic effect of venom of other arthropod, undetermined, initial encounter: Secondary | ICD-10-CM

## 2015-07-17 DIAGNOSIS — T63481A Toxic effect of venom of other arthropod, accidental (unintentional), initial encounter: Secondary | ICD-10-CM | POA: Insufficient documentation

## 2015-07-17 DIAGNOSIS — Z79899 Other long term (current) drug therapy: Secondary | ICD-10-CM | POA: Insufficient documentation

## 2015-07-17 DIAGNOSIS — Y929 Unspecified place or not applicable: Secondary | ICD-10-CM | POA: Insufficient documentation

## 2015-07-17 NOTE — ED Notes (Signed)
Pain and swelling in left elbow after what she thinks was a bug bite.  Pt states that this began this am and has increased.  Pt has swelling up her arm.  Redness, swelling and pain

## 2015-07-17 NOTE — ED Provider Notes (Signed)
CSN: [id number]     Arrival date & time 07/17/15  2325 History  This chart was scribed for Merryl Hacker, MD by Helane Gunther, ED Scribe. This patient was seen in room MH04/MH04 and the patient's care was started at 12:01 AM.    Chief Complaint  Patient presents with  . Insect Bite   The history is provided by the patient. No language interpreter was used.   HPI Comments: Stefanie Wade is a 44 y.o. female who presents to the Emergency Department complaining of left elbow swelling and pain onset this morning. She suspects this is due to an insect bite. She reports associated itchiness, irritation, redness, and swelling. She notes the pain has spread down the left lower arm and rates it as a 7/10. She is able to bend the elbow, but states that this exacerbates the pain. She states that she was around a lot of sweat bees the day before. She tried oral benadryl 1 hour ago with no relief. Pt denies fever.   Past Medical History  Diagnosis Date  . Fibromyalgia   . Arthritis   . Anemia   . Panic attack    Past Surgical History  Procedure Laterality Date  . Cesarean section    . Foot surgery     Family History  Problem Relation Age of Onset  . Diabetes Mother   . Hypertension Mother   . Diabetes Sister   . Diabetes Maternal Grandmother   . Hypertension Maternal Grandmother   . Diabetes Paternal Grandmother   . Hypertension Paternal Grandmother   . Heart attack Neg Hx   . Hyperlipidemia Neg Hx   . Sudden death Neg Hx    History  Substance Use Topics  . Smoking status: Never Smoker   . Smokeless tobacco: Not on file  . Alcohol Use: No   OB History    No data available     Review of Systems  Constitutional: Negative for fever.  Musculoskeletal:       Elbow pain  Skin: Positive for color change.  All other systems reviewed and are negative.   Allergies  Other  Home Medications   Prior to Admission medications   Medication Sig Start Date End Date Taking?  Authorizing Provider  butalbital-acetaminophen-caffeine (FIORICET, ESGIC) 50-325-40 MG per tablet Take 1 tablet by mouth 2 (two) times daily as needed for migraine.    Historical Provider, MD  cephALEXin (KEFLEX) 500 MG capsule Take 1 capsule (500 mg total) by mouth 4 (four) times daily. 07/18/15   Merryl Hacker, MD  ciprofloxacin (CIPRO) 500 MG tablet Take 1 tablet (500 mg total) by mouth every 12 (twelve) hours. 08/17/14   Jennifer Piepenbrink, PA-C  diphenhydrAMINE (BENADRYL) 25 MG tablet Take 1 tablet (25 mg total) by mouth every 8 (eight) hours as needed. 07/18/15   Merryl Hacker, MD  FLUoxetine (PROZAC) 20 MG capsule Take 20 mg by mouth daily.    Historical Provider, MD  gabapentin (NEURONTIN) 300 MG capsule Take 300 mg by mouth 3 (three) times daily. Pt states that she only takes when she feels pain    Historical Provider, MD  HYDROcodone-acetaminophen (NORCO/VICODIN) 5-325 MG per tablet Take 1 tablet by mouth every 6 (six) hours as needed for severe pain. 08/17/14   Jennifer Piepenbrink, PA-C  ibuprofen (ADVIL,MOTRIN) 600 MG tablet Take 1 tablet (600 mg total) by mouth every 6 (six) hours as needed. 07/18/15   Merryl Hacker, MD  levonorgestrel-ethinyl estradiol (NORDETTE) 0.15-30 MG-MCG tablet  Take 1 tablet by mouth daily.    Historical Provider, MD  meloxicam (MOBIC) 7.5 MG tablet Take 7.5 mg by mouth daily.    Historical Provider, MD  ondansetron (ZOFRAN ODT) 4 MG disintegrating tablet Take 1 tablet (4 mg total) by mouth every 8 (eight) hours as needed for nausea or vomiting. 08/17/14   Jennifer Piepenbrink, PA-C  sulfamethoxazole-trimethoprim (BACTRIM DS,SEPTRA DS) 800-160 MG per tablet Take 1 tablet by mouth 2 (two) times daily. 07/18/15 07/25/15  Merryl Hacker, MD   BP 112/80 mmHg  Pulse 71  Temp(Src) 98.4 F (36.9 C) (Oral)  Resp 20  Ht 5\' 6"  (1.676 m)  Wt 155 lb (70.308 kg)  BMI 25.03 kg/m2  SpO2 100%  LMP 07/14/2015 Physical Exam  Constitutional: She is oriented to person,  place, and time. She appears well-developed and well-nourished.  HENT:  Head: Normocephalic and atraumatic.  Cardiovascular: Normal rate and regular rhythm.   Pulmonary/Chest: Effort normal. No respiratory distress.  Musculoskeletal:  Mild swelling noted to the posterior aspect of the left elbow, normal range of motion, mild overlying erythema without fluctuance, punctate bite noted, adjacent urticaria with redness extending proximally  Neurological: She is alert and oriented to person, place, and time.  Skin: Skin is warm and dry. Rash noted.  See musculoskeletal  Psychiatric: She has a normal mood and affect.  Nursing note and vitals reviewed.   ED Course  Procedures  DIAGNOSTIC STUDIES: Oxygen Saturation is 100% on RA, normal by my interpretation.    COORDINATION OF CARE: 12:04 AM - Discussed probable allergic reaction. Discussed plans to order antibiotics and ibuprofen. Advised pt to return to ED if the Sx worsen.  Pt advised of plan for treatment and pt agrees.  Labs Review Labs Reviewed - No data to display  Imaging Review No results found.   EKG Interpretation None      MDM   Final diagnoses:  Local reaction to insect sting, undetermined intent, initial encounter  Cellulitis of left upper extremity    Patient presents with what is likely a local reaction to an insect bite. Does not appear to involve the joint. There may be early cellulitis given the warmth and erythema that is spreading proximally. Patient was given hydroxyzine and ibuprofen for pain. Discuss with patient supportive care at home including Benadryl, ibuprofen as needed. She will be placed on a 5 day course of antibiotics for presumptive early cellulitis. She was given strict return precautions.  After history, exam, and medical workup I feel the patient has been appropriately medically screened and is safe for discharge home. Pertinent diagnoses were discussed with the patient. Patient was given return  precautions.  I personally performed the services described in this documentation, which was scribed in my presence. The recorded information has been reviewed and is accurate.    Merryl Hacker, MD 07/18/15 [id number]

## 2015-07-18 MED ORDER — CEPHALEXIN 500 MG PO CAPS: 500.0000 mg | ORAL_CAPSULE | Freq: Four times a day (QID) | ORAL | Status: DC

## 2015-07-18 MED ORDER — HYDROXYZINE HCL 25 MG PO TABS
25.0000 mg | ORAL_TABLET | Freq: Once | ORAL | Status: AC
  Administered 2015-07-18: 25 mg via ORAL
  Filled 2015-07-18: qty 1

## 2015-07-18 MED ORDER — IBUPROFEN 600 MG PO TABS: 600.0000 mg | ORAL_TABLET | Freq: Four times a day (QID) | ORAL | Status: DC | PRN

## 2015-07-18 MED ORDER — SULFAMETHOXAZOLE-TRIMETHOPRIM 800-160 MG PO TABS: 1.0000 | ORAL_TABLET | Freq: Two times a day (BID) | ORAL | Status: AC

## 2015-07-18 MED ORDER — DIPHENHYDRAMINE HCL 25 MG PO TABS: 25.0000 mg | ORAL_TABLET | Freq: Three times a day (TID) | ORAL | Status: DC | PRN

## 2015-07-18 MED ORDER — IBUPROFEN 800 MG PO TABS
800.0000 mg | ORAL_TABLET | Freq: Once | ORAL | Status: AC
  Administered 2015-07-18: 800 mg via ORAL
  Filled 2015-07-18: qty 1

## 2015-07-18 NOTE — Discharge Instructions (Signed)

## 2016-01-10 ENCOUNTER — Other Ambulatory Visit: Payer: Self-pay

## 2016-01-10 DIAGNOSIS — Z1231 Encounter for screening mammogram for malignant neoplasm of breast: Secondary | ICD-10-CM

## 2016-01-24 ENCOUNTER — Ambulatory Visit
Admission: RE | Admit: 2016-01-24 | Discharge: 2016-01-24 | Disposition: A | Discharge status: Home / Self Care | Payer: Medicaid Other | Source: Ambulatory Visit

## 2016-01-24 DIAGNOSIS — Z1231 Encounter for screening mammogram for malignant neoplasm of breast: Secondary | ICD-10-CM

## 2016-03-03 ENCOUNTER — Encounter (HOSPITAL_BASED_OUTPATIENT_CLINIC_OR_DEPARTMENT_OTHER): Payer: Self-pay | Admitting: Emergency Medicine

## 2016-03-03 ENCOUNTER — Emergency Department (HOSPITAL_BASED_OUTPATIENT_CLINIC_OR_DEPARTMENT_OTHER): Payer: Medicare Other

## 2016-03-03 ENCOUNTER — Emergency Department (HOSPITAL_BASED_OUTPATIENT_CLINIC_OR_DEPARTMENT_OTHER)
Admission: EM | Admit: 2016-03-03 | Discharge: 2016-03-03 | Disposition: A | Discharge status: Home / Self Care | Payer: Medicare Other | Attending: Emergency Medicine | Admitting: Emergency Medicine

## 2016-03-03 DIAGNOSIS — Z793 Long term (current) use of hormonal contraceptives: Secondary | ICD-10-CM | POA: Diagnosis not present

## 2016-03-03 DIAGNOSIS — Z862 Personal history of diseases of the blood and blood-forming organs and certain disorders involving the immune mechanism: Secondary | ICD-10-CM | POA: Diagnosis not present

## 2016-03-03 DIAGNOSIS — Z79899 Other long term (current) drug therapy: Secondary | ICD-10-CM | POA: Insufficient documentation

## 2016-03-03 DIAGNOSIS — M1990 Unspecified osteoarthritis, unspecified site: Secondary | ICD-10-CM | POA: Diagnosis not present

## 2016-03-03 DIAGNOSIS — R079 Chest pain, unspecified: Secondary | ICD-10-CM | POA: Diagnosis not present

## 2016-03-03 DIAGNOSIS — R05 Cough: Secondary | ICD-10-CM | POA: Insufficient documentation

## 2016-03-03 DIAGNOSIS — F410 Panic disorder [episodic paroxysmal anxiety] without agoraphobia: Secondary | ICD-10-CM | POA: Insufficient documentation

## 2016-03-03 DIAGNOSIS — M797 Fibromyalgia: Secondary | ICD-10-CM | POA: Insufficient documentation

## 2016-03-03 DIAGNOSIS — R059 Cough, unspecified: Secondary | ICD-10-CM

## 2016-03-03 DIAGNOSIS — Z791 Long term (current) use of non-steroidal anti-inflammatories (NSAID): Secondary | ICD-10-CM | POA: Diagnosis not present

## 2016-03-03 DIAGNOSIS — R51 Headache: Secondary | ICD-10-CM | POA: Diagnosis not present

## 2016-03-03 DIAGNOSIS — J029 Acute pharyngitis, unspecified: Secondary | ICD-10-CM | POA: Diagnosis not present

## 2016-03-03 DIAGNOSIS — Z792 Long term (current) use of antibiotics: Secondary | ICD-10-CM | POA: Insufficient documentation

## 2016-03-03 LAB — CBC
HCT: 34.9 % — ABNORMAL LOW (ref 36.0–46.0)
Hemoglobin: 11.5 g/dL — ABNORMAL LOW (ref 12.0–15.0)
MCH: 30.8 pg (ref 26.0–34.0)
MCHC: 33 g/dL (ref 30.0–36.0)
MCV: 93.6 fL (ref 78.0–100.0)
Platelets: 145 10*3/uL — ABNORMAL LOW (ref 150–400)
RBC: 3.73 MIL/uL — AB (ref 3.87–5.11)
RDW: 12.3 % (ref 11.5–15.5)
WBC: 3.9 10*3/uL — ABNORMAL LOW (ref 4.0–10.5)

## 2016-03-03 LAB — BASIC METABOLIC PANEL
Anion gap: 9 (ref 5–15)
BUN: 12 mg/dL (ref 6–20)
CALCIUM: 8.6 mg/dL — AB (ref 8.9–10.3)
CHLORIDE: 105 mmol/L (ref 101–111)
CO2: 21 mmol/L — AB (ref 22–32)
CREATININE: 0.84 mg/dL (ref 0.44–1.00)
GFR calc Af Amer: >60 mL/min (ref >60)
GFR calc non Af Amer: >60 mL/min (ref >60)
GLUCOSE: 93 mg/dL (ref 65–99)
Potassium: 3.8 mmol/L (ref 3.5–5.1)
Sodium: 135 mmol/L (ref 135–145)

## 2016-03-03 LAB — TROPONIN I: Troponin I: <0.03 ng/mL (ref <0.031)

## 2016-03-03 MED ORDER — ACETAMINOPHEN 500 MG PO TABS
1000.0000 mg | ORAL_TABLET | Freq: Once | ORAL | Status: AC
  Administered 2016-03-03: 1000 mg via ORAL
  Filled 2016-03-03: qty 2

## 2016-03-03 MED ORDER — BENZONATATE 100 MG PO CAPS: 100.0000 mg | ORAL_CAPSULE | Freq: Three times a day (TID) | ORAL | Status: DC

## 2016-03-03 MED ORDER — ONDANSETRON 4 MG PO TBDP: 4.0000 mg | ORAL_TABLET | Freq: Three times a day (TID) | ORAL | Status: DC | PRN

## 2016-03-03 MED ORDER — IBUPROFEN 800 MG PO TABS: 800.0000 mg | ORAL_TABLET | Freq: Three times a day (TID) | ORAL | Status: DC

## 2016-03-03 MED ORDER — ONDANSETRON 4 MG PO TBDP
4.0000 mg | ORAL_TABLET | Freq: Once | ORAL | Status: AC
  Administered 2016-03-03: 4 mg via ORAL
  Filled 2016-03-03: qty 1

## 2016-03-03 MED ORDER — KETOROLAC TROMETHAMINE 60 MG/2ML IM SOLN
60.0000 mg | Freq: Once | INTRAMUSCULAR | Status: AC
  Administered 2016-03-03: 60 mg via INTRAMUSCULAR
  Filled 2016-03-03: qty 2

## 2016-03-03 NOTE — ED Notes (Signed)
Attempted IV insertion x 2 with no success, was able to obtain labs.

## 2016-03-03 NOTE — ED Notes (Cosign Needed)
Patient presents with cold symptoms. She did complain of chest pain in which my partner has initially evaluated patient and order a delta troponin. The test came back negative. Reassurance given. Patient to follow-up with PCP for further care. Will provide symptomatic treatment. Return percussion discussed.  BP 107/66 mmHg  Pulse 71  Temp(Src) 98.7 F (37.1 C) (Oral)  Resp 14  Ht 5\' 6"  (1.676 m)  SpO2 100%  LMP 02/10/2016 (Approximate)  Results for orders placed or performed during the hospital encounter of [id number]  Basic metabolic panel  Result Value Ref Range   Sodium 135 135 - 145 mmol/L   Potassium 3.8 3.5 - 5.1 mmol/L   Chloride 105 101 - 111 mmol/L   CO2 21 (L) 22 - 32 mmol/L   Glucose, Bld 93 65 - 99 mg/dL   BUN 12 6 - 20 mg/dL   Creatinine, Ser 0.84 0.44 - 1.00 mg/dL   Calcium 8.6 (L) 8.9 - 10.3 mg/dL   GFR calc non Af Amer >60 >60 mL/min   GFR calc Af Amer >60 >60 mL/min   Anion gap 9 5 - 15  CBC  Result Value Ref Range   WBC 3.9 (L) 4.0 - 10.5 K/uL   RBC 3.73 (L) 3.87 - 5.11 MIL/uL   Hemoglobin 11.5 (L) 12.0 - 15.0 g/dL   HCT 34.9 (L) 36.0 - 46.0 %   MCV 93.6 78.0 - 100.0 fL   MCH 30.8 26.0 - 34.0 pg   MCHC 33.0 30.0 - 36.0 g/dL   RDW 12.3 11.5 - 15.5 %   Platelets 145 (L) 150 - 400 K/uL  Troponin I  Result Value Ref Range   Troponin I <0.03 <0.031 ng/mL  Troponin I  Result Value Ref Range   Troponin I <0.03 <0.031 ng/mL   Dg Chest 2 View  03/03/2016  CLINICAL DATA:  Chest tightness for 2 days, initial encounter EXAM: CHEST  2 VIEW COMPARISON:  01/04/2014 FINDINGS: The heart size and mediastinal contours are within normal limits. Both lungs are clear. The visualized skeletal structures are unremarkable. IMPRESSION: No active cardiopulmonary disease. Electronically Signed   By: Inez Catalina M.D.   On: 03/03/2016 15:30      Domenic Moras, PA-C 03/03/16 1830

## 2016-03-03 NOTE — ED Provider Notes (Signed)
CSN: [id number]     Arrival date & time 03/03/16  1349 History   First MD Initiated Contact with Patient 03/03/16 1505     Chief Complaint  Patient presents with  . Chest Pain     (Consider location/radiation/quality/duration/timing/severity/associated sxs/prior Treatment) HPI   Stefanie Wade is a 45 y.o. female, with a history of fibromyalgia and anemia, presenting to the ED with chest tightness that began 2 days ago. Accompanied by a cough, headache, sore throat, and nausea for the last 10 days. Headache is frontal and bilateral, 8/10, throbbing, nonradiating. Chest discomfort is constant, tightness and burning in nature, 8/10, radiating into her throat. Pt has tried Cold & Flu medication with minimal relief. Pt denies shortness of breath, abdominal pain, fever/chills, vomiting, or any other complaints.    Past Medical History  Diagnosis Date  . Fibromyalgia   . Arthritis   . Anemia   . Panic attack    Past Surgical History  Procedure Laterality Date  . Cesarean section    . Foot surgery     Family History  Problem Relation Age of Onset  . Diabetes Mother   . Hypertension Mother   . Diabetes Sister   . Diabetes Maternal Grandmother   . Hypertension Maternal Grandmother   . Diabetes Paternal Grandmother   . Hypertension Paternal Grandmother   . Heart attack Neg Hx   . Hyperlipidemia Neg Hx   . Sudden death Neg Hx    Social History  Substance Use Topics  . Smoking status: Never Smoker   . Smokeless tobacco: None  . Alcohol Use: No   OB History    No data available     Review of Systems  Constitutional: Negative for fever, chills and diaphoresis.  HENT: Positive for congestion and sore throat.   Respiratory: Positive for cough. Negative for shortness of breath.   Cardiovascular: Positive for chest pain.  Gastrointestinal: Positive for nausea. Negative for vomiting, abdominal pain, diarrhea and constipation.  Genitourinary: Negative for dysuria.   Neurological: Positive for headaches. Negative for syncope and weakness.  All other systems reviewed and are negative.     Allergies  Other  Home Medications   Prior to Admission medications   Medication Sig Start Date End Date Taking? Authorizing Provider  benzonatate (TESSALON) 100 MG capsule Take 1 capsule (100 mg total) by mouth every 8 (eight) hours. 03/03/16   Ladrea Holladay C Abie Cheek, PA-C  butalbital-acetaminophen-caffeine (FIORICET, ESGIC) 50-325-40 MG per tablet Take 1 tablet by mouth 2 (two) times daily as needed for migraine.    Historical Provider, MD  cephALEXin (KEFLEX) 500 MG capsule Take 1 capsule (500 mg total) by mouth 4 (four) times daily. 07/18/15   Merryl Hacker, MD  ciprofloxacin (CIPRO) 500 MG tablet Take 1 tablet (500 mg total) by mouth every 12 (twelve) hours. 08/17/14   Jennifer Piepenbrink, PA-C  diphenhydrAMINE (BENADRYL) 25 MG tablet Take 1 tablet (25 mg total) by mouth every 8 (eight) hours as needed. 07/18/15   Merryl Hacker, MD  FLUoxetine (PROZAC) 20 MG capsule Take 20 mg by mouth daily.    Historical Provider, MD  gabapentin (NEURONTIN) 300 MG capsule Take 300 mg by mouth 3 (three) times daily. Pt states that she only takes when she feels pain    Historical Provider, MD  HYDROcodone-acetaminophen (NORCO/VICODIN) 5-325 MG per tablet Take 1 tablet by mouth every 6 (six) hours as needed for severe pain. 08/17/14   Jennifer Piepenbrink, PA-C  ibuprofen (ADVIL,MOTRIN) 600 MG tablet  Take 1 tablet (600 mg total) by mouth every 6 (six) hours as needed. 07/18/15   Merryl Hacker, MD  ibuprofen (ADVIL,MOTRIN) 800 MG tablet Take 1 tablet (800 mg total) by mouth 3 (three) times daily. 03/03/16   Lorayne Bender, PA-C  levonorgestrel-ethinyl estradiol (NORDETTE) 0.15-30 MG-MCG tablet Take 1 tablet by mouth daily.    Historical Provider, MD  meloxicam (MOBIC) 7.5 MG tablet Take 7.5 mg by mouth daily.    Historical Provider, MD  ondansetron (ZOFRAN ODT) 4 MG disintegrating tablet Take 1  tablet (4 mg total) by mouth every 8 (eight) hours as needed for nausea or vomiting. 08/17/14   Jennifer Piepenbrink, PA-C  ondansetron (ZOFRAN ODT) 4 MG disintegrating tablet Take 1 tablet (4 mg total) by mouth every 8 (eight) hours as needed for nausea or vomiting. 03/03/16   Perseus Westall C Davan Hark, PA-C   BP 114/75 mmHg  Pulse 71  Temp(Src) 98.7 F (37.1 C) (Oral)  Resp 18  Ht 5\' 6"  (1.676 m)  SpO2 100%  LMP 02/10/2016 (Approximate) Physical Exam  Constitutional: She is oriented to person, place, and time. She appears well-developed and well-nourished. No distress.  HENT:  Head: Normocephalic and atraumatic.  Right Ear: Tympanic membrane, external ear and ear canal normal.  Left Ear: Tympanic membrane, external ear and ear canal normal.  Nose: Right sinus exhibits maxillary sinus tenderness and frontal sinus tenderness. Left sinus exhibits maxillary sinus tenderness and frontal sinus tenderness.  Mouth/Throat: Uvula is midline, oropharynx is clear and moist and mucous membranes are normal.  Eyes: Conjunctivae and EOM are normal. Pupils are equal, round, and reactive to light.  Neck: Normal range of motion. Neck supple.  Cardiovascular: Normal rate, regular rhythm, normal heart sounds and intact distal pulses.   Pulmonary/Chest: Effort normal and breath sounds normal. No respiratory distress.  Abdominal: Soft. Bowel sounds are normal. There is no tenderness. There is no guarding.  Musculoskeletal: She exhibits no edema or tenderness.  Full ROM in all extremities and spine. No paraspinal tenderness.   Lymphadenopathy:    She has no cervical adenopathy.  Neurological: She is alert and oriented to person, place, and time. She has normal reflexes.  No sensory deficits. Strength 5/5 in all extremities. No gait disturbance. Coordination intact. Cranial nerves III-XII grossly intact. No facial droop.   Skin: Skin is warm and dry. She is not diaphoretic.  Psychiatric: She has a normal mood and affect. Her  behavior is normal.  Nursing note and vitals reviewed.   ED Course  Procedures (including critical care time) Labs Review Labs Reviewed  BASIC METABOLIC PANEL - Abnormal; Notable for the following:    CO2 21 (*)    Calcium 8.6 (*)    All other components within normal limits  CBC - Abnormal; Notable for the following:    WBC 3.9 (*)    RBC 3.73 (*)    Hemoglobin 11.5 (*)    HCT 34.9 (*)    Platelets 145 (*)    All other components within normal limits  TROPONIN I  TROPONIN I    Imaging Review Dg Chest 2 View  03/03/2016  CLINICAL DATA:  Chest tightness for 2 days, initial encounter EXAM: CHEST  2 VIEW COMPARISON:  01/04/2014 FINDINGS: The heart size and mediastinal contours are within normal limits. Both lungs are clear. The visualized skeletal structures are unremarkable. IMPRESSION: No active cardiopulmonary disease. Electronically Signed   By: Inez Catalina M.D.   On: 03/03/2016 15:30   I have  personally reviewed and evaluated these images and lab results as part of my medical decision-making.   EKG Interpretation   Date/Time:  Saturday March 03 2016 13:55:15 EDT Ventricular Rate:  76 PR Interval:  170 QRS Duration: 66 QT Interval:  354 QTC Calculation: 398 R Axis:   39 Text Interpretation:  Normal sinus rhythm Normal ECG No significant change  since last tracing Confirmed by [facility]  MD, MARTHA [phone]) on 03/03/2016  2:06:38 PM      MDM   Final diagnoses:  Chest pain, unspecified chest pain type  Cough  Sore throat    Laelah Kluver presents with cough, chest discomfort, nausea, and sore throat for the last 2 days.  Suspect that the source of the patient's symptoms is a viral illness combined with possible fibromyalgia pain. Low suspicion for ACS. HEART score is 0, indicating low risk for a cardiac event. Wells criteria score is 0, indicating low risk for PE. Patient is nontoxic appearing, afebrile, not tachycardic, not tachypneic, maintains SPO2 of 100% on  room air, and is in no apparent distress. Patient has no signs of sepsis or other serious or life-threatening condition. Labs are unremarkable when compared with previous values. Chest x-ray is normal.  5:12 PM End of shift patient care handoff report given to Domenic Moras, PA-C. Plan: Await the patient's second troponin. If negative, discharge patient with the pre-prepared documentation. Patient to follow up with PCP if symptoms continue.   Filed Vitals:   03/03/16 1357 03/03/16 1441 03/03/16 1602  BP: 107/62 112/72 114/75  Pulse: 76 70 71  Temp: 98.7 F (37.1 C)    TempSrc: Oral    Resp: 19  18  Height: 5\' 6"  (1.676 m)    SpO2: 100% 100% 100%     Lorayne Bender, PA-C 03/03/16 1713  Merrily Pew, MD 03/07/16 1551

## 2016-03-03 NOTE — ED Notes (Signed)
Patient transported to X-ray and returned 

## 2016-03-03 NOTE — Discharge Instructions (Signed)
You have been seen today for chest pain, cough, nausea, and sore throat. Your imaging and lab tests showed no abnormalities. Your symptoms are consistent with a viral illness. Viruses do not require antibiotics. Treatment is symptomatic care. Drink plenty of fluids and get plenty of rest. Ibuprofen or Tylenol for pain or fever. Zofran for nausea. Tessalon for cough. Plain Mucinex may help relieve congestion. Warm liquids or Chloraseptic spray may help soothe the sore throat. Follow up with PCP as needed if symptoms continue. Return to ED should symptoms worsen.

## 2016-03-03 NOTE — ED Notes (Signed)
Pt in c/o chest tightness, worse on inspiration, onset 2 days ago and worse today. Pain is accompanied by nausea, lightheadedness, dizziness. Pt is ambulatory with steady gait, speaking in complete sentences in NAD.

## 2016-06-20 ENCOUNTER — Other Ambulatory Visit: Payer: Self-pay | Admitting: Obstetrics and Gynecology

## 2016-06-22 ENCOUNTER — Encounter (HOSPITAL_COMMUNITY): Payer: Self-pay

## 2016-06-22 NOTE — Patient Instructions (Signed)
Your procedure is scheduled on:  Monday, July 02, 2016  Enter through the Main Entrance of [facility] at: 10:00 AM  Pick up the phone at the desk and dial [phone].  Call this number if you have problems the morning of surgery: [phone].  Remember: Do NOT eat food or drink after:  Midnight Sunday, July 01, 2016  Take these medicines the morning of surgery with a SIP OF WATER:  Xanax, Prozac, Gabapentin  Do NOT wear jewelry (body piercing), metal hair clips/bobby pins, make-up, or nail polish. Do NOT wear lotions, powders, or perfumes.  You may wear deodorant. Do NOT shave for 48 hours prior to surgery. Do NOT bring valuables to the hospital. Contacts, dentures, or bridgework may not be worn into surgery.  Have a responsible adult drive you home and stay with you for 24 hours after your procedure

## 2016-06-25 ENCOUNTER — Encounter (HOSPITAL_COMMUNITY)
Admission: RE | Admit: 2016-06-25 | Discharge: 2016-06-25 | Disposition: A | Discharge status: Home / Self Care | Payer: Medicare Other | Source: Ambulatory Visit | Attending: Obstetrics and Gynecology | Admitting: Obstetrics and Gynecology

## 2016-06-25 ENCOUNTER — Encounter (HOSPITAL_COMMUNITY): Payer: Self-pay

## 2016-06-25 DIAGNOSIS — F4310 Post-traumatic stress disorder, unspecified: Secondary | ICD-10-CM | POA: Insufficient documentation

## 2016-06-25 DIAGNOSIS — K219 Gastro-esophageal reflux disease without esophagitis: Secondary | ICD-10-CM | POA: Diagnosis not present

## 2016-06-25 DIAGNOSIS — M797 Fibromyalgia: Secondary | ICD-10-CM | POA: Insufficient documentation

## 2016-06-25 DIAGNOSIS — R5382 Chronic fatigue, unspecified: Secondary | ICD-10-CM | POA: Diagnosis not present

## 2016-06-25 DIAGNOSIS — Z01812 Encounter for preprocedural laboratory examination: Secondary | ICD-10-CM | POA: Insufficient documentation

## 2016-06-25 DIAGNOSIS — D649 Anemia, unspecified: Secondary | ICD-10-CM | POA: Diagnosis not present

## 2016-06-25 DIAGNOSIS — F410 Panic disorder [episodic paroxysmal anxiety] without agoraphobia: Secondary | ICD-10-CM | POA: Diagnosis not present

## 2016-06-25 HISTORY — DX: Anesthesia of skin: R20.0

## 2016-06-25 HISTORY — DX: Gastro-esophageal reflux disease without esophagitis: K21.9

## 2016-06-25 HISTORY — DX: Carpal tunnel syndrome, bilateral upper limbs: G56.03

## 2016-06-25 HISTORY — DX: Chronic fatigue, unspecified: R53.82

## 2016-06-25 HISTORY — DX: Headache, unspecified: R51.9

## 2016-06-25 HISTORY — DX: Paresthesia of skin: R20.2

## 2016-06-25 HISTORY — DX: Post-traumatic stress disorder, unspecified: F43.10

## 2016-06-25 HISTORY — DX: Hypotension, unspecified: I95.9

## 2016-06-25 HISTORY — DX: Reserved for inherently not codable concepts without codable children: IMO0001

## 2016-06-25 HISTORY — DX: Headache: R51

## 2016-06-25 LAB — CBC
HCT: 34.9 % — ABNORMAL LOW (ref 36.0–46.0)
HEMOGLOBIN: 11.4 g/dL — AB (ref 12.0–15.0)
MCH: 30.3 pg (ref 26.0–34.0)
MCHC: 32.7 g/dL (ref 30.0–36.0)
MCV: 92.8 fL (ref 78.0–100.0)
PLATELETS: 298 10*3/uL (ref 150–400)
RBC: 3.76 MIL/uL — ABNORMAL LOW (ref 3.87–5.11)
RDW: 12.9 % (ref 11.5–15.5)
WBC: 3.1 10*3/uL — ABNORMAL LOW (ref 4.0–10.5)

## 2016-07-02 ENCOUNTER — Encounter (HOSPITAL_COMMUNITY): Payer: Self-pay | Admitting: Anesthesiology

## 2016-07-02 ENCOUNTER — Ambulatory Visit (HOSPITAL_COMMUNITY)
Admission: RE | Admit: 2016-07-02 | Payer: Medicare Other | Source: Ambulatory Visit | Admitting: Obstetrics and Gynecology

## 2016-07-02 ENCOUNTER — Encounter (HOSPITAL_COMMUNITY): Admission: RE | Payer: Self-pay | Source: Ambulatory Visit

## 2016-07-02 SURGERY — LIGATION, FALLOPIAN TUBE, LAPAROSCOPIC
Anesthesia: Choice | Laterality: Bilateral

## 2016-07-02 MED ORDER — BUPIVACAINE HCL (PF) 0.25 % IJ SOLN
INTRAMUSCULAR | Status: AC
  Filled 2016-07-02: qty 30

## 2016-08-24 ENCOUNTER — Other Ambulatory Visit: Payer: Self-pay | Admitting: Obstetrics and Gynecology

## 2016-08-28 NOTE — Patient Instructions (Addendum)
Your procedure is scheduled on:  Monday, Sept. 18, 2017  Enter through the [employer] [facility] at:  10:15 AM  Pick up the phone at the desk and dial [phone].  Call this number if you have problems the morning of surgery: [phone].  Remember: Do NOT eat food or drink after:  Midnight Sunday  Take these medicines the morning of surgery with a SIP OF WATER:  None  Do NOT wear jewelry (body piercing), metal hair clips/bobby pins, make-up, or nail polish. Do NOT wear lotions, powders, or perfumes.  You may wear deodorant. Do NOT shave for 48 hours prior to surgery. Do NOT bring valuables to the hospital. Contacts, dentures, or bridgework may not be worn into surgery.  Have a responsible adult drive you home and stay with you for 24 hours after your procedure

## 2016-08-29 ENCOUNTER — Encounter (HOSPITAL_COMMUNITY): Payer: Self-pay

## 2016-08-29 ENCOUNTER — Encounter (HOSPITAL_COMMUNITY)
Admission: RE | Admit: 2016-08-29 | Discharge: 2016-08-29 | Disposition: A | Discharge status: Home / Self Care | Payer: Medicare Other | Source: Ambulatory Visit | Attending: Obstetrics and Gynecology | Admitting: Obstetrics and Gynecology

## 2016-08-29 DIAGNOSIS — M797 Fibromyalgia: Secondary | ICD-10-CM | POA: Insufficient documentation

## 2016-08-29 DIAGNOSIS — Z79899 Other long term (current) drug therapy: Secondary | ICD-10-CM | POA: Diagnosis not present

## 2016-08-29 DIAGNOSIS — Z975 Presence of (intrauterine) contraceptive device: Secondary | ICD-10-CM | POA: Diagnosis not present

## 2016-08-29 DIAGNOSIS — K219 Gastro-esophageal reflux disease without esophagitis: Secondary | ICD-10-CM | POA: Diagnosis not present

## 2016-08-29 DIAGNOSIS — Z885 Allergy status to narcotic agent status: Secondary | ICD-10-CM | POA: Insufficient documentation

## 2016-08-29 DIAGNOSIS — Z01812 Encounter for preprocedural laboratory examination: Secondary | ICD-10-CM | POA: Insufficient documentation

## 2016-08-29 DIAGNOSIS — R5382 Chronic fatigue, unspecified: Secondary | ICD-10-CM | POA: Diagnosis not present

## 2016-08-29 HISTORY — DX: Other specified postprocedural states: Z98.890

## 2016-08-29 HISTORY — DX: Nausea with vomiting, unspecified: R11.2

## 2016-08-29 HISTORY — DX: Dizziness and giddiness: R42

## 2016-08-29 LAB — CBC
HEMATOCRIT: 36.1 % (ref 36.0–46.0)
Hemoglobin: 11.9 g/dL — ABNORMAL LOW (ref 12.0–15.0)
MCH: 30.2 pg (ref 26.0–34.0)
MCHC: 33 g/dL (ref 30.0–36.0)
MCV: 91.6 fL (ref 78.0–100.0)
Platelets: 307 10*3/uL (ref 150–400)
RBC: 3.94 MIL/uL (ref 3.87–5.11)
RDW: 12.7 % (ref 11.5–15.5)
WBC: 3.6 10*3/uL — ABNORMAL LOW (ref 4.0–10.5)

## 2016-09-02 MED ORDER — SCOPOLAMINE 1 MG/3DAYS TD PT72: 1.0000 | MEDICATED_PATCH | Freq: Once | TRANSDERMAL | Status: DC

## 2016-09-03 ENCOUNTER — Encounter (HOSPITAL_COMMUNITY): Payer: Self-pay

## 2016-09-03 ENCOUNTER — Ambulatory Visit (HOSPITAL_COMMUNITY): Payer: Medicare Other | Admitting: Anesthesiology

## 2016-09-03 ENCOUNTER — Encounter (HOSPITAL_COMMUNITY)
Admission: RE | Disposition: A | Discharge status: Home / Self Care | Payer: Self-pay | Source: Ambulatory Visit | Attending: Obstetrics and Gynecology

## 2016-09-03 ENCOUNTER — Ambulatory Visit (HOSPITAL_COMMUNITY)
Admission: RE | Admit: 2016-09-03 | Discharge: 2016-09-03 | Disposition: A | Discharge status: Home / Self Care | Payer: Medicare Other | Source: Ambulatory Visit | Attending: Obstetrics and Gynecology | Admitting: Obstetrics and Gynecology

## 2016-09-03 DIAGNOSIS — Z302 Encounter for sterilization: Secondary | ICD-10-CM | POA: Insufficient documentation

## 2016-09-03 DIAGNOSIS — Z30432 Encounter for removal of intrauterine contraceptive device: Secondary | ICD-10-CM | POA: Diagnosis not present

## 2016-09-03 HISTORY — PX: IUD REMOVAL: SHX5392

## 2016-09-03 HISTORY — PX: LAPAROSCOPIC TUBAL LIGATION: SHX1937

## 2016-09-03 LAB — PREGNANCY, URINE: Preg Test, Ur: NEGATIVE

## 2016-09-03 SURGERY — LIGATION, FALLOPIAN TUBE, LAPAROSCOPIC
Anesthesia: General

## 2016-09-03 MED ORDER — PROPOFOL 10 MG/ML IV BOLUS
INTRAVENOUS | Status: AC
  Filled 2016-09-03: qty 20

## 2016-09-03 MED ORDER — LIDOCAINE HCL (CARDIAC) 20 MG/ML IV SOLN
INTRAVENOUS | Status: DC | PRN
  Administered 2016-09-03: 100 mg via INTRAVENOUS

## 2016-09-03 MED ORDER — KETOROLAC TROMETHAMINE 30 MG/ML IJ SOLN
INTRAMUSCULAR | Status: AC
  Filled 2016-09-03: qty 1

## 2016-09-03 MED ORDER — LACTATED RINGERS IV SOLN: INTRAVENOUS | Status: DC

## 2016-09-03 MED ORDER — ONDANSETRON HCL 4 MG/2ML IJ SOLN
INTRAMUSCULAR | Status: AC
  Filled 2016-09-03: qty 2

## 2016-09-03 MED ORDER — FENTANYL CITRATE (PF) 100 MCG/2ML IJ SOLN
25.0000 ug | INTRAMUSCULAR | Status: DC | PRN
  Administered 2016-09-03: 50 ug via INTRAVENOUS

## 2016-09-03 MED ORDER — KETOROLAC TROMETHAMINE 30 MG/ML IJ SOLN: 30.0000 mg | Freq: Once | INTRAMUSCULAR | Status: DC

## 2016-09-03 MED ORDER — ONDANSETRON HCL 4 MG/2ML IJ SOLN
INTRAMUSCULAR | Status: DC | PRN
  Administered 2016-09-03: 4 mg via INTRAVENOUS

## 2016-09-03 MED ORDER — SCOPOLAMINE 1 MG/3DAYS TD PT72
MEDICATED_PATCH | TRANSDERMAL | Status: AC
  Administered 2016-09-03: 1.5 mg via TRANSDERMAL
  Filled 2016-09-03: qty 1

## 2016-09-03 MED ORDER — FENTANYL CITRATE (PF) 100 MCG/2ML IJ SOLN
INTRAMUSCULAR | Status: AC
  Filled 2016-09-03: qty 2

## 2016-09-03 MED ORDER — NEOSTIGMINE METHYLSULFATE 10 MG/10ML IV SOLN
INTRAVENOUS | Status: AC
  Filled 2016-09-03: qty 1

## 2016-09-03 MED ORDER — NEOSTIGMINE METHYLSULFATE 10 MG/10ML IV SOLN
INTRAVENOUS | Status: DC | PRN
  Administered 2016-09-03: 3.5 mg via INTRAVENOUS

## 2016-09-03 MED ORDER — PROPOFOL 10 MG/ML IV BOLUS
INTRAVENOUS | Status: DC | PRN
Start: 2016-09-03 — End: 2016-09-03
  Administered 2016-09-03: 150 mg via INTRAVENOUS

## 2016-09-03 MED ORDER — ROCURONIUM BROMIDE 100 MG/10ML IV SOLN
INTRAVENOUS | Status: DC | PRN
  Administered 2016-09-03 (×2): 10 mg via INTRAVENOUS

## 2016-09-03 MED ORDER — IBUPROFEN 200 MG PO TABS
200.0000 mg | ORAL_TABLET | Freq: Four times a day (QID) | ORAL | Status: DC | PRN
  Filled 2016-09-03: qty 2

## 2016-09-03 MED ORDER — BUPIVACAINE HCL (PF) 0.25 % IJ SOLN
INTRAMUSCULAR | Status: DC | PRN
  Administered 2016-09-03: 27 mL

## 2016-09-03 MED ORDER — IBUPROFEN 100 MG/5ML PO SUSP
200.0000 mg | Freq: Four times a day (QID) | ORAL | Status: DC | PRN
  Filled 2016-09-03: qty 20

## 2016-09-03 MED ORDER — OXYCODONE HCL 5 MG PO TABS
ORAL_TABLET | ORAL | Status: AC
  Filled 2016-09-03: qty 1

## 2016-09-03 MED ORDER — KETOROLAC TROMETHAMINE 30 MG/ML IJ SOLN
INTRAMUSCULAR | Status: DC | PRN
Start: 2016-09-03 — End: 2016-09-03
  Administered 2016-09-03: 30 mg via INTRAVENOUS

## 2016-09-03 MED ORDER — LACTATED RINGERS IV SOLN
INTRAVENOUS | Status: DC
  Administered 2016-09-03: 12:00:00 via INTRAVENOUS
  Administered 2016-09-03: 125 mL/h via INTRAVENOUS

## 2016-09-03 MED ORDER — MIDAZOLAM HCL 2 MG/2ML IJ SOLN
INTRAMUSCULAR | Status: DC | PRN
  Administered 2016-09-03: 2 mg via INTRAVENOUS

## 2016-09-03 MED ORDER — GLYCOPYRROLATE 0.2 MG/ML IJ SOLN
INTRAMUSCULAR | Status: DC | PRN
  Administered 2016-09-03: 0.2 mg via INTRAVENOUS
  Administered 2016-09-03: .6 mg via INTRAVENOUS

## 2016-09-03 MED ORDER — CEFAZOLIN SODIUM-DEXTROSE 2-3 GM-% IV SOLR
2.0000 g | Freq: Once | INTRAVENOUS | Status: AC
  Administered 2016-09-03: 2 g via INTRAVENOUS
  Filled 2016-09-03: qty 50

## 2016-09-03 MED ORDER — ONDANSETRON HCL 4 MG/2ML IJ SOLN: 4.0000 mg | Freq: Once | INTRAMUSCULAR | Status: DC | PRN

## 2016-09-03 MED ORDER — OXYCODONE HCL 5 MG PO TABS
5.0000 mg | ORAL_TABLET | Freq: Once | ORAL | Status: AC | PRN
  Administered 2016-09-03: 5 mg via ORAL

## 2016-09-03 MED ORDER — LIDOCAINE HCL (CARDIAC) 20 MG/ML IV SOLN
INTRAVENOUS | Status: AC
  Filled 2016-09-03: qty 5

## 2016-09-03 MED ORDER — OXYCODONE HCL 5 MG/5ML PO SOLN: 5.0000 mg | Freq: Once | ORAL | Status: AC | PRN

## 2016-09-03 MED ORDER — DEXAMETHASONE SODIUM PHOSPHATE 10 MG/ML IJ SOLN
INTRAMUSCULAR | Status: AC
  Filled 2016-09-03: qty 1

## 2016-09-03 MED ORDER — MIDAZOLAM HCL 2 MG/2ML IJ SOLN
INTRAMUSCULAR | Status: AC
  Filled 2016-09-03: qty 2

## 2016-09-03 MED ORDER — OXYCODONE-ACETAMINOPHEN 5-325 MG PO TABS: 1.0000 | ORAL_TABLET | Freq: Once | ORAL | Status: DC

## 2016-09-03 MED ORDER — SCOPOLAMINE 1 MG/3DAYS TD PT72
1.0000 | MEDICATED_PATCH | Freq: Once | TRANSDERMAL | Status: DC
  Administered 2016-09-03: 1.5 mg via TRANSDERMAL

## 2016-09-03 MED ORDER — SUCCINYLCHOLINE CHLORIDE 20 MG/ML IJ SOLN
INTRAMUSCULAR | Status: DC | PRN
  Administered 2016-09-03: 100 mg via INTRAVENOUS

## 2016-09-03 MED ORDER — BUPIVACAINE HCL (PF) 0.25 % IJ SOLN
INTRAMUSCULAR | Status: AC
  Filled 2016-09-03: qty 30

## 2016-09-03 MED ORDER — MEPERIDINE HCL 25 MG/ML IJ SOLN: 6.2500 mg | INTRAMUSCULAR | Status: DC | PRN

## 2016-09-03 MED ORDER — FENTANYL CITRATE (PF) 100 MCG/2ML IJ SOLN
INTRAMUSCULAR | Status: DC | PRN
  Administered 2016-09-03: 100 ug via INTRAVENOUS

## 2016-09-03 MED ORDER — GLYCOPYRROLATE 0.2 MG/ML IJ SOLN
INTRAMUSCULAR | Status: AC
  Filled 2016-09-03: qty 3

## 2016-09-03 SURGICAL SUPPLY — 20 items
CATH ROBINSON RED A/P 16FR (CATHETERS) IMPLANT
CLIP FILSHIE TUBAL LIGA STRL (Clip) ×4 IMPLANT
CLOTH BEACON ORANGE TIMEOUT ST (SAFETY) ×4 IMPLANT
DRSG COVADERM PLUS 2X2 (GAUZE/BANDAGES/DRESSINGS) IMPLANT
DRSG OPSITE POSTOP 3X4 (GAUZE/BANDAGES/DRESSINGS) ×4 IMPLANT
GLOVE BIOGEL PI IND STRL 7.0 (GLOVE) ×6 IMPLANT
GLOVE BIOGEL PI INDICATOR 7.0 (GLOVE) ×6
GLOVE ECLIPSE 7.0 STRL STRAW (GLOVE) ×4 IMPLANT
GOWN STRL REUS W/TWL LRG LVL3 (GOWN DISPOSABLE) ×8 IMPLANT
LIQUID BAND (GAUZE/BANDAGES/DRESSINGS) ×4 IMPLANT
PACK LAPAROSCOPY BASIN (CUSTOM PROCEDURE TRAY) ×4 IMPLANT
PAD TRENDELENBURG POSITION (MISCELLANEOUS) ×4 IMPLANT
SLEEVE XCEL OPT CAN 5 100 (ENDOMECHANICALS) IMPLANT
SUT VICRYL 0 UR6 27IN ABS (SUTURE) ×4 IMPLANT
SUT VICRYL RAPIDE 4/0 PS 2 (SUTURE) ×4 IMPLANT
TOWEL OR 17X24 6PK STRL BLUE (TOWEL DISPOSABLE) ×4 IMPLANT
TROCAR BALLN 12MMX100 BLUNT (TROCAR) ×4 IMPLANT
TROCAR XCEL NON-BLD 5MMX100MML (ENDOMECHANICALS) IMPLANT
WARMER LAPAROSCOPE (MISCELLANEOUS) ×4 IMPLANT
WATER STERILE IRR 1000ML POUR (IV SOLUTION) ×4 IMPLANT

## 2016-09-03 NOTE — Transfer of Care (Signed)
Immediate Anesthesia Transfer of Care Note  Patient: Stefanie Wade  Procedure(s) Performed: Procedure(s) with comments: LAPAROSCOPIC TUBAL LIGATION (N/A) - FILSHIE CLIPS  Patient Location: PACU  Anesthesia Type:General  Level of Consciousness: sedated  Airway & Oxygen Therapy: Patient Spontanous Breathing and Patient connected to nasal cannula oxygen  Post-op Assessment: Report given to RN and Post -op Vital signs reviewed and stable  Post vital signs: stable  Last Vitals:  Vitals:   09/03/16 1047  BP: 99/69  Pulse: 65  Resp: 16  Temp: 36.9 C    Last Pain:  Vitals:   09/03/16 1047  TempSrc: Oral  PainSc: 8       Patients Stated Pain Goal: 3 ([id number])  Complications: No apparent anesthesia complications

## 2016-09-03 NOTE — H&P (Signed)
Stefanie Wade is a 45 y/o black female who presents for IUD removal and BTL. She states that she understands the procedure, the risks and failure rates.  Chief Complaint: HPI:  Past Medical History:  Diagnosis Date  . Anemia   . Arthritis   . Bilateral carpal tunnel syndrome   . Chronic fatigue   . Fibromyalgia   . GERD (gastroesophageal reflux disease)   . Headache    Migraines  . Low blood pressure   . Lower extremity numbness   . Numbness and tingling of both upper extremities while sleeping   . Panic attack   . PONV (postoperative nausea and vomiting)   . PTSD (post-traumatic stress disorder)   . Shortness of breath dyspnea    with stairs  . Vertigo     Past Surgical History:  Procedure Laterality Date  . CESAREAN SECTION    . FOOT SURGERY      Family History  Problem Relation Age of Onset  . Diabetes Mother   . Hypertension Mother   . Diabetes Sister   . Diabetes Maternal Grandmother   . Hypertension Maternal Grandmother   . Diabetes Paternal Grandmother   . Hypertension Paternal Grandmother   . Heart attack Neg Hx   . Hyperlipidemia Neg Hx   . Sudden death Neg Hx    Social History:  reports that she has never smoked. She has never used smokeless tobacco. She reports that she drinks alcohol. She reports that she does not use drugs.  Allergies:  Allergies  Allergen Reactions  . Other     Tylenol #3 becomes jittery when taking    Medications Prior to Admission  Medication Sig Dispense Refill  . loratadine (CLARITIN) 10 MG tablet Take 10 mg by mouth daily.    Marland Kitchen ALPRAZolam (XANAX) 1 MG tablet Take 1 mg by mouth 3 (three) times daily as needed. For anxiety  0  . cetirizine (ZYRTEC) 10 MG tablet Take 10 mg by mouth daily as needed. For allergies  2  . clindamycin (CLINDAGEL) 1 % gel Apply 1 application topically 2 (two) times daily as needed. For acne  0  . diclofenac sodium (VOLTAREN) 1 % GEL Apply 1 application topically 2 (two) times daily as needed. For  pain  2  . FLUoxetine (PROZAC) 20 MG capsule Take 20 mg by mouth 3 (three) times daily.     Marland Kitchen gabapentin (NEURONTIN) 300 MG capsule Take 300 mg by mouth 2 (two) times daily.     Marland Kitchen ibuprofen (ADVIL,MOTRIN) 800 MG tablet Take 1 tablet (800 mg total) by mouth 3 (three) times daily. (Patient not taking: Reported on 06/18/2016) 21 tablet 0  . levonorgestrel (MIRENA) 20 MCG/24HR IUD 1 each by Intrauterine route once.    . midodrine (PROAMATINE) 10 MG tablet Take 10 mg by mouth daily as needed (low blood pressure).    . Multiple Vitamins-Minerals (ONE-A-DAY VITACRAVES PO) Take 2 each by mouth daily.    . ondansetron (ZOFRAN ODT) 4 MG disintegrating tablet Take 1 tablet (4 mg total) by mouth every 8 (eight) hours as needed for nausea or vomiting. (Patient not taking: Reported on 06/18/2016) 20 tablet 0  . Oxycodone HCl 10 MG TABS Take 10 mg by mouth every 6 (six) hours as needed. for pain  0  . Vitamin D, Ergocalciferol, (DRISDOL) 50000 units CAPS capsule Take 1 capsule by mouth once a week.  1       Last menstrual period 08/21/2016. General appearance: alert, cooperative and no  distress Lungs: clear to auscultation bilaterally Abdomen: soft, non-tender; bowel sounds normal; no masses,  no organomegaly   Lab Results  Component Value Date   WBC 3.6 (L) 08/29/2016   HGB 11.9 (L) 08/29/2016   HCT 36.1 08/29/2016   MCV 91.6 08/29/2016   PLT 307 08/29/2016   Lab Results  Component Value Date   PREGTESTUR NEGATIVE 08/17/2014       Patient Active Problem List   Diagnosis Date Noted  . 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

## 2016-09-03 NOTE — Anesthesia Procedure Notes (Signed)
Procedure Name: Intubation Date/Time: 09/03/2016 11:06 AM Performed by: Lyn Hollingshead Pre-anesthesia Checklist: Patient identified, Emergency Drugs available, Suction available, Patient being monitored and Timeout performed Patient Re-evaluated:Patient Re-evaluated prior to inductionOxygen Delivery Method: Circle system utilized Preoxygenation: Pre-oxygenation with 100% oxygen Intubation Type: IV induction Ventilation: Mask ventilation without difficulty Grade View: Grade II Tube type: Oral Tube size: 7.0 mm Number of attempts: 1 Placement Confirmation: ETT inserted through vocal cords under direct vision,  positive ETCO2 and breath sounds checked- equal and bilateral Secured at: 21 cm Tube secured with: Tape Dental Injury: Teeth and Oropharynx as per pre-operative assessment

## 2016-09-03 NOTE — Discharge Instructions (Signed)
DISCHARGE INSTRUCTIONS: Laparoscopy  The following instructions have been prepared to help you care for yourself upon your return home today.  Wound care:  Do not get the incision wet for the first 24 hours. The incision should be kept clean and dry.  The Band-Aids or dressings may be removed the day after surgery.  Should the incision become sore, red, and swollen after the first week, check with your doctor.  Personal hygiene:  Shower the day after your procedure.  Activity and limitations:  Do NOT drive or operate any equipment today.  Do NOT lift anything more than 15 pounds for 2-3 weeks after surgery.  Do NOT rest in bed all day.  Walking is encouraged. Walk each day, starting slowly with 5-minute walks 3 or 4 times a day. Slowly increase the length of your walks.  Walk up and down stairs slowly.  Do NOT do strenuous activities, such as golfing, playing tennis, bowling, running, biking, weight lifting, gardening, mowing, or vacuuming for 2-4 weeks. Ask your doctor when it is okay to start.  Diet: Eat a light meal as desired this evening. You may resume your usual diet tomorrow.  Return to work: This is dependent on the type of work you do. For the most part you can return to a desk job within a week of surgery. If you are more active at work, please discuss this with your doctor.  What to expect after your surgery: You may have a slight burning sensation when you urinate on the first day. You may have a very small amount of blood in the urine. Expect to have a small amount of vaginal discharge/light bleeding for 1-2 weeks. It is not unusual to have abdominal soreness and bruising for up to 2 weeks. You may be tired and need more rest for about 1 week. You may experience shoulder pain for 24-72 hours. Lying flat in bed may relieve it.  Call your doctor for any of the following:  Develop a fever of 100.4 or greater  Inability to urinate 6 hours after discharge from  hospital  Severe pain not relieved by pain medications  Persistent of heavy bleeding at incision site  Redness or swelling around incision site after a week  Increasing nausea or vomiting  Patient Signature________________________________________ Nurse Signature_________________________________________   No ibuprofen products(motrin, advil) or aleve until 6:30pm today.    Post Anesthesia Home Care Instructions  Activity: Get plenty of rest for the remainder of the day. A responsible adult should stay with you for 24 hours following the procedure.  For the next 24 hours, DO NOT: -Drive a car -Paediatric nurse -Drink alcoholic beverages -Take any medication unless instructed by your physician -Make any legal decisions or sign important papers.  Meals: Start with liquid foods such as gelatin or soup. Progress to regular foods as tolerated. Avoid greasy, spicy, heavy foods. If nausea and/or vomiting occur, drink only clear liquids until the nausea and/or vomiting subsides. Call your physician if vomiting continues.  Special Instructions/Symptoms: Your throat may feel dry or sore from the anesthesia or the breathing tube placed in your throat during surgery. If this causes discomfort, gargle with warm salt water. The discomfort should disappear within 24 hours.  If you had a scopolamine patch placed behind your ear for the management of post- operative nausea and/or vomiting:  1. The medication in the patch is effective for 72 hours, after which it should be removed.  Wrap patch in a tissue and discard in the  trash. Wash hands thoroughly with soap and water. °2. You may remove the patch earlier than 72 hours if you experience unpleasant side effects which may include dry mouth, dizziness or visual disturbances. °3. Avoid touching the patch. Wash your hands with soap and water after contact with the patch. °  ° °

## 2016-09-03 NOTE — Anesthesia Preprocedure Evaluation (Signed)
Anesthesia Evaluation  Patient identified by MRN, date of birth, ID band Patient awake    Reviewed: Allergy & Precautions, H&P , NPO status , Patient's Chart, lab work & pertinent test results, reviewed documented beta blocker date and time   Airway Mallampati: I  TM Distance: >3 FB Neck ROM: full    Dental no notable dental hx. (+) Teeth Intact   Pulmonary neg pulmonary ROS,    Pulmonary exam normal        Cardiovascular negative cardio ROS Normal cardiovascular exam     Neuro/Psych    GI/Hepatic Neg liver ROS,   Endo/Other  negative endocrine ROS  Renal/GU negative Renal ROS     Musculoskeletal   Abdominal Normal abdominal exam  (+)   Peds  Hematology   Anesthesia Other Findings   Reproductive/Obstetrics negative OB ROS                             Anesthesia Physical Anesthesia Plan  ASA: II  Anesthesia Plan: General   Post-op Pain Management:    Induction: Intravenous  Airway Management Planned: Oral ETT  Additional Equipment:   Intra-op Plan:   Post-operative Plan: Extubation in OR  Informed Consent: I have reviewed the patients History and Physical, chart, labs and discussed the procedure including the risks, benefits and alternatives for the proposed anesthesia with the patient or authorized representative who has indicated his/her understanding and acceptance.   Dental Advisory Given  Plan Discussed with: CRNA and Surgeon  Anesthesia Plan Comments:         Anesthesia Quick Evaluation

## 2016-09-04 ENCOUNTER — Encounter (HOSPITAL_COMMUNITY): Payer: Self-pay | Admitting: Obstetrics and Gynecology

## 2016-09-04 NOTE — Op Note (Signed)
NAMEDIANDRIA, MAS NO.:  [id number]  MEDICAL RECORD NO.:  [id number]  LOCATION:  [facility]                          FACILITY:  [facility]  PHYSICIAN:  Freda Munro, M.D.    DATE OF BIRTH:  [birth year]  DATE OF PROCEDURE:  09/03/2016 DATE OF DISCHARGE:  09/03/2016                              OPERATIVE REPORT   PREOPERATIVE DIAGNOSES: 1. The patient desires permanent sterilization. 2. The patient desires removal of Mirena IUD. 3. Uterine fibroids.  POSTOPERATIVE DIAGNOSES: 1. The patient desires permanent sterilization. 2. The patient desires removal of Mirena IUD. 3. Uterine fibroids.  PROCEDURE: 1. Laparoscopic bilateral tubal ligation with Filshie clips. 2. Removal of Mirena IUD.  SURGEON:  Freda Munro, M.D.  ANESTHESIA:  General and local.  ANTIBIOTICS:  Ancef 2 g.  DRAINS:  Red rubber catheter bladder.  SPECIMENS:  None.  FINDINGS:  The patient had multiple uterine fibroids.  The uterus appeared to be approximately 10 weeks in size.  The IUD was removed without difficulty.  The fallopian tubes and ovaries were within normal limits.  PROCEDURE IN DETAIL:  The patient was taken to the operating room, where a general anesthetic was administered without difficulty.  She was then placed in a dorsal lithotomy position.  She was prepped and draped in usual fashion for this procedure.  Her bladder was drained with a red rubber catheter.  A speculum was placed in the vagina.  IUD was removed. A Hulka tenaculum was applied to the anterior cervical lip.  Attention was then turned to the umbilicus.  The patient's infraumbilical region was injected with 0.25% Marcaine.  An infraumbilical incision was made through the skin.  The fascia was grasped and opened laterally.  The Hasson cannula was placed in the abdominal cavity.  3 L of carbon dioxide was insufflated.  The patient was placed in Trendelenburg. Examination of the pelvic contents revealed no pelvic  adhesions or endometriosis.  The above noted findings were seen.  At this point, the Filshie clip applicator was placed through the scope and then into the patient's abdomen.  The right fallopian tube was then grasped with the Filshie clip applicator and carried to its fimbriated end for identification.  The clip was placed in the isthmic portion of the fallopian tube.  The entire tube appeared to be within the clasp.  The clip was placed perpendicular to the tube.  The clip appeared to be tightly closed.  A similar procedure was performed on the opposite side. Following this, a 10 mL of Marcaine was dripped over each fallopian tube.  Following this, the instruments were removed.  Pneumoperitoneum released.  The fascia was closed with 0 Monocryl suture in a running fashion.  The skin was closed with 4-0 Rapide suture in a subcuticular fashion.  The patient was awoken and taken to the recovery room in stable condition.  The Hulka tenaculum was removed from her cervix.  She was discharged to home.  She was instructed to follow up in the office in 4 weeks.  She was sent home with Percocet and Advil to take as needed.  She will call the office with any changes or problems with her incision.  ______________________________ Freda Munro, M.D.     MA/MEDQ  D:  09/03/2016  T:  09/04/2016  Job:  [id number]

## 2016-09-04 NOTE — Anesthesia Postprocedure Evaluation (Signed)
Anesthesia Post Note  Patient: Academic librarian  Procedure(s) Performed: Procedure(s) (LRB): LAPAROSCOPIC TUBAL LIGATION (N/A) INTRAUTERINE DEVICE (IUD) REMOVAL  Patient location during evaluation: PACU Anesthesia Type: General Level of consciousness: awake Pain management: pain level controlled Vital Signs Assessment: post-procedure vital signs reviewed and stable Respiratory status: spontaneous breathing Cardiovascular status: stable Postop Assessment: no signs of nausea or vomiting Anesthetic complications: no     Last Vitals:  Vitals:   09/03/16 1345 09/03/16 1435  BP: 107/72 107/64  Pulse: 60 62  Resp: 16 18  Temp:      Last Pain:  Vitals:   09/03/16 1435  TempSrc:   PainSc: 3    Pain Goal: Patients Stated Pain Goal: 3 (09/03/16 1047)               [place]

## 2017-02-27 ENCOUNTER — Other Ambulatory Visit: Payer: Self-pay | Admitting: Internal Medicine

## 2017-02-27 DIAGNOSIS — Z1231 Encounter for screening mammogram for malignant neoplasm of breast: Secondary | ICD-10-CM

## 2017-03-25 ENCOUNTER — Ambulatory Visit
Admission: RE | Admit: 2017-03-25 | Discharge: 2017-03-25 | Disposition: A | Discharge status: Home / Self Care | Payer: Medicare Other | Source: Ambulatory Visit | Attending: Internal Medicine | Admitting: Internal Medicine

## 2017-03-25 ENCOUNTER — Other Ambulatory Visit: Payer: Self-pay | Admitting: Internal Medicine

## 2017-03-25 DIAGNOSIS — Z1231 Encounter for screening mammogram for malignant neoplasm of breast: Secondary | ICD-10-CM

## 2017-06-03 ENCOUNTER — Encounter (HOSPITAL_COMMUNITY): Payer: Self-pay

## 2017-06-03 ENCOUNTER — Emergency Department (HOSPITAL_COMMUNITY)
Admission: EM | Admit: 2017-06-03 | Discharge: 2017-06-03 | Disposition: A | Discharge status: Home / Self Care | Payer: Medicare Other | Attending: Emergency Medicine | Admitting: Emergency Medicine

## 2017-06-03 ENCOUNTER — Emergency Department (HOSPITAL_COMMUNITY): Payer: Medicare Other

## 2017-06-03 DIAGNOSIS — R079 Chest pain, unspecified: Secondary | ICD-10-CM

## 2017-06-03 DIAGNOSIS — R0789 Other chest pain: Secondary | ICD-10-CM | POA: Diagnosis present

## 2017-06-03 DIAGNOSIS — Z79899 Other long term (current) drug therapy: Secondary | ICD-10-CM | POA: Insufficient documentation

## 2017-06-03 LAB — I-STAT TROPONIN, ED
TROPONIN I, POC: 0 ng/mL (ref 0.00–0.08)
Troponin i, poc: 0 ng/mL (ref 0.00–0.08)

## 2017-06-03 LAB — CBC
HCT: 37.5 % (ref 36.0–46.0)
Hemoglobin: 12 g/dL (ref 12.0–15.0)
MCH: 30.1 pg (ref 26.0–34.0)
MCHC: 32 g/dL (ref 30.0–36.0)
MCV: 94 fL (ref 78.0–100.0)
PLATELETS: 303 10*3/uL (ref 150–400)
RBC: 3.99 MIL/uL (ref 3.87–5.11)
RDW: 12.6 % (ref 11.5–15.5)
WBC: 4.1 10*3/uL (ref 4.0–10.5)

## 2017-06-03 LAB — BASIC METABOLIC PANEL
Anion gap: 6 (ref 5–15)
BUN: 8 mg/dL (ref 6–20)
CALCIUM: 9.1 mg/dL (ref 8.9–10.3)
CHLORIDE: 105 mmol/L (ref 101–111)
CO2: 25 mmol/L (ref 22–32)
CREATININE: 0.87 mg/dL (ref 0.44–1.00)
GFR calc non Af Amer: >60 mL/min (ref >60)
GLUCOSE: 102 mg/dL — AB (ref 65–99)
Potassium: 3.6 mmol/L (ref 3.5–5.1)
Sodium: 136 mmol/L (ref 135–145)

## 2017-06-03 LAB — D-DIMER, QUANTITATIVE: D-Dimer, Quant: <0.27 ug/mL-FEU (ref 0.00–0.50)

## 2017-06-03 MED ORDER — LIDOCAINE 5 % EX PTCH
1.0000 | MEDICATED_PATCH | CUTANEOUS | Status: DC
  Administered 2017-06-03: 1 via TRANSDERMAL
  Filled 2017-06-03: qty 1

## 2017-06-03 MED ORDER — LIDOCAINE 5 % EX PTCH: 1.0000 | MEDICATED_PATCH | CUTANEOUS | 0 refills | Status: DC

## 2017-06-03 NOTE — ED Notes (Signed)
Pt now being taken back to room

## 2017-06-03 NOTE — ED Triage Notes (Signed)
Patient complains of 1 week of left anterior chest pain that she describes as intermittent and sharp. Pain with inspiration and movement, denies trauma. On arrival alert and oriented, VSS. NAD

## 2017-06-03 NOTE — ED Notes (Signed)
Pt departed in NAD.  

## 2017-06-03 NOTE — ED Notes (Signed)
ED Provider at bedside. 

## 2017-06-03 NOTE — ED Provider Notes (Signed)
[facility] DEPT Provider Note   CSN: [id number] Arrival date & time: 06/03/17  1504     History   Chief Complaint Chief Complaint  Patient presents with  . Chest Pain    HPI Stefanie Wade is a 46 y.o. female.  46 year old female with a history of fibromyalgia, arthritis, anxiety, esophageal reflux, PTSD, and migraine headaches presents to the emergency department for evaluation of chest pain. She notes that symptoms have been persistent over the past week. She notes no cause of onset of her pain; it starts spontaneously. She notes a sharp pressure and tightness in the center of her chest. This is worse with deep breathing as well as movement or laughing. Patient takes daily oxycodone and gabapentin for her fibromyalgia. She notes no improvement with these medicines. Symptoms associated with shortness of breath as well as lightheadedness and dizziness. Patient has also noted intermittent nausea. No associated fever, syncope, vomiting, extremity weakness, unilateral leg swelling. No recent surgeries or hospitalizations. Patient with no history of diabetes, hypertension, dyslipidemia. She is not a smoker. There is family history of coronary artery disease in her mother with fatal MI at 99.      Past Medical History:  Diagnosis Date  . Anemia   . Arthritis   . Bilateral carpal tunnel syndrome   . Chronic fatigue   . Fibromyalgia   . GERD (gastroesophageal reflux disease)   . Headache    Migraines  . Low blood pressure   . Lower extremity numbness   . Numbness and tingling of both upper extremities while sleeping   . Panic attack   . PONV (postoperative nausea and vomiting)   . PTSD (post-traumatic stress disorder)   . Shortness of breath dyspnea    with stairs  . Vertigo     Patient Active Problem List   Diagnosis Date Noted  . Right knee injury 09/17/2011    Past Surgical History:  Procedure Laterality Date  . CESAREAN SECTION    . FOOT SURGERY    . IUD  REMOVAL  09/03/2016   Procedure: INTRAUTERINE DEVICE (IUD) REMOVAL;  Surgeon: Olga Millers, MD;  Location: [facility];  Service: Gynecology;;  . LAPAROSCOPIC TUBAL LIGATION N/A 09/03/2016   Procedure: LAPAROSCOPIC TUBAL LIGATION;  Surgeon: Olga Millers, MD;  Location: [facility];  Service: Gynecology;  Laterality: N/A;  FILSHIE CLIPS    OB History    No data available       Home Medications    Prior to Admission medications   Medication Sig Start Date End Date Taking? Authorizing Provider  ALPRAZolam Duanne Moron) 1 MG tablet Take 1 mg by mouth every 6 (six) hours as needed for anxiety.  05/16/17  Yes [provider]  cetirizine (ZYRTEC) 10 MG tablet Take 10 mg by mouth daily as needed. For allergies 03/20/16  Yes [provider]  cycloSPORINE (RESTASIS) 0.05 % ophthalmic emulsion Place 1 drop into both eyes 2 (two) times daily.   Yes [provider]  FLUoxetine (PROZAC) 20 MG capsule Take 20 mg by mouth 2 (two) times daily.   Yes [provider]  gabapentin (NEURONTIN) 300 MG capsule Take 300 mg by mouth 3 (three) times daily.   Yes [provider]  midodrine (PROAMATINE) 10 MG tablet Take 10 mg by mouth 3 (three) times daily as needed (for blood pressure).    Yes [provider]  Oxycodone HCl 10 MG TABS Take 10 mg by mouth 3 (three) times daily. 05/16/17  Yes [provider]  ibuprofen (ADVIL,MOTRIN) 800 MG tablet Take 1 tablet (800 mg total) by mouth 3 (three) times daily. Patient not taking: Reported on 06/18/2016 03/03/16   Joy, Shawn C, PA-C  lidocaine (LIDODERM) 5 % Place 1 patch onto the skin daily. Remove & Discard patch within 12 hours or as directed by MD 06/03/17   Antonietta Breach, PA-C  ondansetron (ZOFRAN ODT) 4 MG disintegrating tablet Take 1 tablet (4 mg total) by mouth every 8 (eight) hours as needed for nausea or vomiting. Patient not taking: Reported on 06/18/2016 03/03/16   Lorayne Bender, PA-C    Family History Family History    Problem Relation Age of Onset  . Diabetes Mother   . Hypertension Mother   . Diabetes Sister   . Diabetes Maternal Grandmother   . Hypertension Maternal Grandmother   . Diabetes Paternal Grandmother   . Hypertension Paternal Grandmother   . Heart attack Neg Hx   . Hyperlipidemia Neg Hx   . Sudden death Neg Hx     Social History Social History  Substance Use Topics  . Smoking status: Never Smoker  . Smokeless tobacco: Never Used  . Alcohol use Yes     Allergies   Other   Review of Systems Review of Systems Ten systems reviewed and are negative for acute change, except as noted in the HPI.    Physical Exam Updated Vital Signs BP 107/73   Pulse 61   Temp 98.6 F (37 C) (Oral)   Resp 12   Ht 5\' 6"  (1.676 m)   Wt 69.9 kg (154 lb)   SpO2 100%   BMI 24.86 kg/m   Physical Exam  Constitutional: She is oriented to person, place, and time. She appears well-developed and well-nourished. No distress.  Nontoxic and in NAD  HENT:  Head: Normocephalic and atraumatic.  Eyes: Conjunctivae and EOM are normal. No scleral icterus.  Neck: Normal range of motion.  No JVD  Cardiovascular: Normal rate, regular rhythm and intact distal pulses.   Pulmonary/Chest: Effort normal. No respiratory distress. She has no wheezes. She has no rales.  Lungs CTAB. Respirations even and unlabored  Musculoskeletal: Normal range of motion.  No BLE edema.  Neurological: She is alert and oriented to person, place, and time. She exhibits normal muscle tone. Coordination normal.  Skin: Skin is warm and dry. No rash noted. She is not diaphoretic. No erythema. No pallor.  Psychiatric: She has a normal mood and affect. Her behavior is normal.  Nursing note and vitals reviewed.    ED Treatments / Results  Labs (all labs ordered are listed, but only abnormal results are displayed) Labs Reviewed  BASIC METABOLIC PANEL - Abnormal; Notable for the following:       Result Value   Glucose, Bld 102  (*)    All other components within normal limits  CBC  D-DIMER, QUANTITATIVE (NOT AT [facility])  I-STAT TROPOININ, ED  I-STAT TROPOININ, ED    EKG  EKG Interpretation  Date/Time:  Monday June 03 2017 15:12:07 EDT Ventricular Rate:  63 PR Interval:  180 QRS Duration: 68 QT Interval:  380 QTC Calculation: 388 R Axis:   55 Text Interpretation:  Normal sinus rhythm Septal infarct , age undetermined Abnormal ECG No significant change since last tracing Confirmed by Gareth Morgan [phone]) on 06/03/2017 11:04:02 PM       Radiology Dg Chest 2 View  Result Date: 06/03/2017 CLINICAL DATA:  One-week history of progressively worsening yet and left-sided  chest pain, dizziness and nausea. EXAM: CHEST  2 VIEW COMPARISON:  03/03/2016, 01/04/2014 and earlier. FINDINGS: Cardiomediastinal silhouette unremarkable, unchanged. Lungs clear. Bronchovascular markings normal. Pulmonary vascularity normal. No visible pleural effusions. No pneumothorax. Slight upper thoracic levoscoliosis and lower thoracic dextroscoliosis as noted previously. IMPRESSION: No acute cardiopulmonary disease.  Stable examination. Electronically Signed   By: Evangeline Dakin M.D.   On: 06/03/2017 16:53    Procedures Procedures (including critical care time)  Medications Ordered in ED Medications  lidocaine (LIDODERM) 5 % 1 patch (1 patch Transdermal Patch Applied 06/03/17 2140)     Initial Impression / Assessment and Plan / ED Course  I have reviewed the triage vital signs and the nursing notes.  Pertinent labs & imaging results that were available during my care of the patient were reviewed by me and considered in my medical decision making (see chart for details).     9:00 PM Patient with reassuring work up and exam. CXR negative for acute cardiopulmonary abnormality. Heart score 2 c/w low risk of ACS. Symptoms not c/w dissection. Given pleuritic nature of symptoms, dimer added. Will also cycle troponins. Lidocaine patch  ordered for pain as fibromyalgia suspected to be cause of patient's pain.  10:25 PM Lab called about D dimer. They report results momentarily.  10:40 PM Dimer negative. Low pretest probability for PE; therefore, felt unlikely to be cause of pain today.  10:45 PM Patient reassessed. She has had some improvement in her pain following use of a Lidoderm patch lending itself to musculoskeletal etiology. Also unable to exclude anxiety. Patient with known history of this. Will refer to cardiology for follow up. I have advised completion of a stress test within the next 30 days. Return precautions discussed and provided. Patient discharged in stable condition with no unaddressed concerns.   Final Clinical Impressions(s) / ED Diagnoses   Final diagnoses:  Nonspecific chest pain    New Prescriptions New Prescriptions   LIDOCAINE (LIDODERM) 5 %    Place 1 patch onto the skin daily. Remove & Discard patch within 12 hours or as directed by MD     Antonietta Breach, PA-C 06/03/17 9150    Gareth Morgan, MD 06/05/17 1341

## 2017-06-03 NOTE — ED Notes (Signed)
Attempted to draw labs, without success. Contacted Phlebotomy for assistance.

## 2018-03-11 ENCOUNTER — Emergency Department (HOSPITAL_COMMUNITY): Payer: Medicare HMO

## 2018-03-11 ENCOUNTER — Emergency Department (HOSPITAL_COMMUNITY)
Admission: EM | Admit: 2018-03-11 | Discharge: 2018-03-11 | Discharge status: Against Medical Advice | Payer: Medicare HMO | Attending: Emergency Medicine | Admitting: Emergency Medicine

## 2018-03-11 ENCOUNTER — Encounter (HOSPITAL_COMMUNITY): Payer: Self-pay | Admitting: Emergency Medicine

## 2018-03-11 DIAGNOSIS — R0789 Other chest pain: Secondary | ICD-10-CM | POA: Diagnosis present

## 2018-03-11 DIAGNOSIS — Z5321 Procedure and treatment not carried out due to patient leaving prior to being seen by health care provider: Secondary | ICD-10-CM | POA: Diagnosis not present

## 2018-03-11 LAB — BASIC METABOLIC PANEL
ANION GAP: 9 (ref 5–15)
BUN: 11 mg/dL (ref 6–20)
CALCIUM: 8.8 mg/dL — AB (ref 8.9–10.3)
CO2: 23 mmol/L (ref 22–32)
Chloride: 105 mmol/L (ref 101–111)
Creatinine, Ser: 0.82 mg/dL (ref 0.44–1.00)
Glucose, Bld: 86 mg/dL (ref 65–99)
Potassium: 3.8 mmol/L (ref 3.5–5.1)
Sodium: 137 mmol/L (ref 135–145)

## 2018-03-11 LAB — CBC
HCT: 36.8 % (ref 36.0–46.0)
HEMOGLOBIN: 11.6 g/dL — AB (ref 12.0–15.0)
MCH: 29.7 pg (ref 26.0–34.0)
MCHC: 31.5 g/dL (ref 30.0–36.0)
MCV: 94.4 fL (ref 78.0–100.0)
Platelets: 318 10*3/uL (ref 150–400)
RBC: 3.9 MIL/uL (ref 3.87–5.11)
RDW: 12.8 % (ref 11.5–15.5)
WBC: 3.6 10*3/uL — ABNORMAL LOW (ref 4.0–10.5)

## 2018-03-11 LAB — I-STAT BETA HCG BLOOD, ED (MC, WL, AP ONLY): I-stat hCG, quantitative: <5 m[IU]/mL (ref <5)

## 2018-03-11 LAB — I-STAT TROPONIN, ED: TROPONIN I, POC: 0 ng/mL (ref 0.00–0.08)

## 2018-03-11 NOTE — ED Triage Notes (Signed)
Per pt, states she has been having intermittent chest pain for the past few days-states she told her PCP and he referred her here-states some SOB and dizziness-history of anxiety

## 2018-11-10 ENCOUNTER — Encounter (HOSPITAL_COMMUNITY): Payer: Self-pay | Admitting: Emergency Medicine

## 2018-11-10 ENCOUNTER — Ambulatory Visit (HOSPITAL_COMMUNITY)
Admission: EM | Admit: 2018-11-10 | Discharge: 2018-11-10 | Disposition: A | Discharge status: Home / Self Care | Payer: Medicare HMO | Attending: Family Medicine | Admitting: Family Medicine

## 2018-11-10 DIAGNOSIS — M546 Pain in thoracic spine: Secondary | ICD-10-CM

## 2018-11-10 LAB — POCT URINALYSIS DIP (DEVICE)
Bilirubin Urine: NEGATIVE
Glucose, UA: NEGATIVE mg/dL
Ketones, ur: NEGATIVE mg/dL
LEUKOCYTES UA: NEGATIVE
NITRITE: NEGATIVE
Protein, ur: NEGATIVE mg/dL
Specific Gravity, Urine: 1.015 (ref 1.005–1.030)
UROBILINOGEN UA: 0.2 mg/dL (ref 0.0–1.0)
pH: 6.5 (ref 5.0–8.0)

## 2018-11-10 MED ORDER — CYCLOBENZAPRINE HCL 10 MG PO TABS: 10.0000 mg | ORAL_TABLET | Freq: Two times a day (BID) | ORAL | 0 refills | Status: DC | PRN

## 2018-11-10 NOTE — Discharge Instructions (Addendum)
Your urine was negative for infection. You can discontinue the antibiotics.  We will try some muscle relaxers to see if this helps Make sure you are drinking plenty of water.  If your symptoms continue despite treatment you need to follow-up with your doctor for further management

## 2018-11-10 NOTE — ED Triage Notes (Signed)
Pt c/o back pain since Friday, pt is pointing to her L flank area, denies issues with urination.

## 2018-11-12 NOTE — ED Provider Notes (Signed)
[place]    CSN: [id number] Arrival date & time: 11/10/18  0814     History   Chief Complaint Chief Complaint  Patient presents with  . Back Pain    HPI Stefanie Wade is a 47 y.o. female.    Back Pain  Location:  Thoracic spine Quality:  Aching, cramping and stiffness Stiffness is present:  All day Radiates to:  Does not radiate Pain severity:  Moderate Pain is:  Same all the time Onset quality:  Gradual Duration:  5 days Timing:  Constant Progression:  Waxing and waning Chronicity:  New Context: not emotional stress, not falling, not jumping from heights, not lifting heavy objects, not MCA, not MVA, not occupational injury, not pedestrian accident, not physical stress, not recent illness, not recent injury and not twisting   Relieved by:  Nothing Worsened by:  Movement, touching, sitting, deep breathing and coughing Ineffective treatments:  Narcotics, heating pad, bed rest and being still Associated symptoms: no abdominal pain, no abdominal swelling, no bladder incontinence, no bowel incontinence, no chest pain, no dysuria, no fever, no headaches, no leg pain, no numbness, no paresthesias, no pelvic pain, no perianal numbness, no tingling, no weakness and no weight loss     Past Medical History:  Diagnosis Date  . Anemia   . Arthritis   . Bilateral carpal tunnel syndrome   . Chronic fatigue   . Fibromyalgia   . GERD (gastroesophageal reflux disease)   . Headache    Migraines  . Low blood pressure   . Lower extremity numbness   . Numbness and tingling of both upper extremities while sleeping   . Panic attack   . PONV (postoperative nausea and vomiting)   . PTSD (post-traumatic stress disorder)   . Shortness of breath dyspnea    with stairs  . Vertigo     Patient Active Problem List   Diagnosis Date Noted  . Right knee injury 09/17/2011    Past Surgical History:  Procedure Laterality Date  . CESAREAN SECTION    . FOOT SURGERY    .  IUD REMOVAL  09/03/2016   Procedure: INTRAUTERINE DEVICE (IUD) REMOVAL;  Surgeon: Olga Millers, MD;  Location: [facility];  Service: Gynecology;;  . LAPAROSCOPIC TUBAL LIGATION N/A 09/03/2016   Procedure: LAPAROSCOPIC TUBAL LIGATION;  Surgeon: Olga Millers, MD;  Location: [facility];  Service: Gynecology;  Laterality: N/A;  FILSHIE CLIPS    OB History   None      Home Medications    Prior to Admission medications   Medication Sig Start Date End Date Taking? Authorizing Provider  ALPRAZolam Duanne Moron) 1 MG tablet Take 1 mg by mouth every 6 (six) hours as needed for anxiety.  05/16/17   [provider]  cetirizine (ZYRTEC) 10 MG tablet Take 10 mg by mouth daily as needed. For allergies 03/20/16   [provider]  cyclobenzaprine (FLEXERIL) 10 MG tablet Take 1 tablet (10 mg total) by mouth 2 (two) times daily as needed for muscle spasms. 11/10/18   Loura Halt A, NP  cycloSPORINE (RESTASIS) 0.05 % ophthalmic emulsion Place 1 drop into both eyes 2 (two) times daily.    [provider]  FLUoxetine (PROZAC) 20 MG capsule Take 20 mg by mouth 2 (two) times daily.    [provider]  gabapentin (NEURONTIN) 300 MG capsule Take 300 mg by mouth 3 (three) times daily.    [provider]  ibuprofen (ADVIL,MOTRIN) 800 MG tablet Take 1  tablet (800 mg total) by mouth 3 (three) times daily. Patient not taking: Reported on 06/18/2016 03/03/16   Joy, Shawn C, PA-C  lidocaine (LIDODERM) 5 % Place 1 patch onto the skin daily. Remove & Discard patch within 12 hours or as directed by MD Patient not taking: Reported on 11/10/2018 06/03/17   Antonietta Breach, PA-C  midodrine (PROAMATINE) 10 MG tablet Take 10 mg by mouth 3 (three) times daily as needed (for blood pressure).     [provider]  ondansetron (ZOFRAN ODT) 4 MG disintegrating tablet Take 1 tablet (4 mg total) by mouth every 8 (eight) hours as needed for nausea or vomiting. Patient not taking: Reported on 06/18/2016  03/03/16   Lorayne Bender, PA-C  Oxycodone HCl 10 MG TABS Take 10 mg by mouth 3 (three) times daily. 05/16/17   [provider]    Family History Family History  Problem Relation Age of Onset  . Diabetes Mother   . Hypertension Mother   . Diabetes Sister   . Diabetes Maternal Grandmother   . Hypertension Maternal Grandmother   . Diabetes Paternal Grandmother   . Hypertension Paternal Grandmother   . Heart attack Neg Hx   . Hyperlipidemia Neg Hx   . Sudden death Neg Hx     Social History Social History   Tobacco Use  . Smoking status: Never Smoker  . Smokeless tobacco: Never Used  Substance Use Topics  . Alcohol use: Yes  . Drug use: No     Allergies   Other   Review of Systems Review of Systems  Constitutional: Negative for fever and weight loss.  Cardiovascular: Negative for chest pain.  Gastrointestinal: Negative for abdominal pain and bowel incontinence.  Genitourinary: Negative for bladder incontinence, dysuria and pelvic pain.  Musculoskeletal: Positive for back pain.  Neurological: Negative for tingling, weakness, numbness, headaches and paresthesias.     Physical Exam Triage Vital Signs ED Triage Vitals [11/10/18 1118]  Enc Vitals Group     BP 100/66     Pulse Rate 68     Resp 16     Temp 98.4 F (36.9 C)     Temp src      SpO2 100 %     Weight      Height      Head Circumference      Peak Flow      Pain Score 10     Pain Loc      Pain Edu?      Excl. in [place]?    No data found.  Updated Vital Signs BP 100/66   Pulse 68   Temp 98.4 F (36.9 C)   Resp 16   SpO2 100%   Visual Acuity Right Eye Distance:   Left Eye Distance:   Bilateral Distance:    Right Eye Near:   Left Eye Near:    Bilateral Near:     Physical Exam  Constitutional: She appears well-developed and well-nourished.  HENT:  Head: Normocephalic.  Right Ear: External ear normal.  Left Ear: External ear normal.  Eyes: Conjunctivae are normal.  Neck: Normal  range of motion.  Cardiovascular: Normal rate, regular rhythm and normal heart sounds.  Pulmonary/Chest: Effort normal and breath sounds normal. No respiratory distress. She exhibits no tenderness.  Musculoskeletal: Normal range of motion. She exhibits tenderness.  Pt TTP of the left thoracic paravertebral muscles. No bruising, swelling or deformity.    Nursing note and vitals reviewed.    UC  Treatments / Results  Labs (all labs ordered are listed, but only abnormal results are displayed) Labs Reviewed  POCT URINALYSIS DIP (DEVICE) - Abnormal; Notable for the following components:      Result Value   Hgb urine dipstick SMALL (*)    All other components within normal limits    EKG None  Radiology No results found.  Procedures Procedures (including critical care time)  Medications Ordered in UC Medications - No data to display  Initial Impression / Assessment and Plan / UC Course  I have reviewed the triage vital signs and the nursing notes.  Pertinent labs & imaging results that were available during my care of the patient were reviewed by me and considered in my medical decision making (see chart for details).     Urine negative for infection Exam most consistent with muscle strain/spasm.  Will treat with flexeril Follow up as needed for continued or worsening symptoms  Final Clinical Impressions(s) / UC Diagnoses   Final diagnoses:  Acute left-sided thoracic back pain     Discharge Instructions     Your urine was negative for infection. You can discontinue the antibiotics.  We will try some muscle relaxers to see if this helps Make sure you are drinking plenty of water.  If your symptoms continue despite treatment you need to follow-up with your doctor for further management    ED Prescriptions    Medication Sig Dispense Auth. Provider   cyclobenzaprine (FLEXERIL) 10 MG tablet Take 1 tablet (10 mg total) by mouth 2 (two) times daily as needed for muscle  spasms. 20 tablet Orvan July, NP     Controlled Substance Prescriptions [place] Controlled Substance Registry consulted? no   Orvan July, NP 11/12/18 2033

## 2019-01-12 ENCOUNTER — Other Ambulatory Visit: Payer: Self-pay | Admitting: Internal Medicine

## 2019-01-12 DIAGNOSIS — Z1231 Encounter for screening mammogram for malignant neoplasm of breast: Secondary | ICD-10-CM

## 2019-01-29 ENCOUNTER — Ambulatory Visit: Payer: Medicare HMO

## 2019-02-03 ENCOUNTER — Ambulatory Visit
Admission: RE | Admit: 2019-02-03 | Discharge: 2019-02-03 | Disposition: A | Discharge status: Home / Self Care | Payer: Medicare HMO | Source: Ambulatory Visit | Attending: Internal Medicine | Admitting: Internal Medicine

## 2019-02-03 DIAGNOSIS — Z1231 Encounter for screening mammogram for malignant neoplasm of breast: Secondary | ICD-10-CM

## 2019-03-25 IMAGING — MG DIGITAL SCREENING BILATERAL MAMMOGRAM WITH TOMO AND CAD
8 series · 9 of 24 positions shown · non-contrast
Comparison: Previous exam(s).

CLINICAL DATA: Screening.

EXAM:
DIGITAL SCREENING BILATERAL MAMMOGRAM WITH TOMO AND CAD

[L MLO synth-2D]
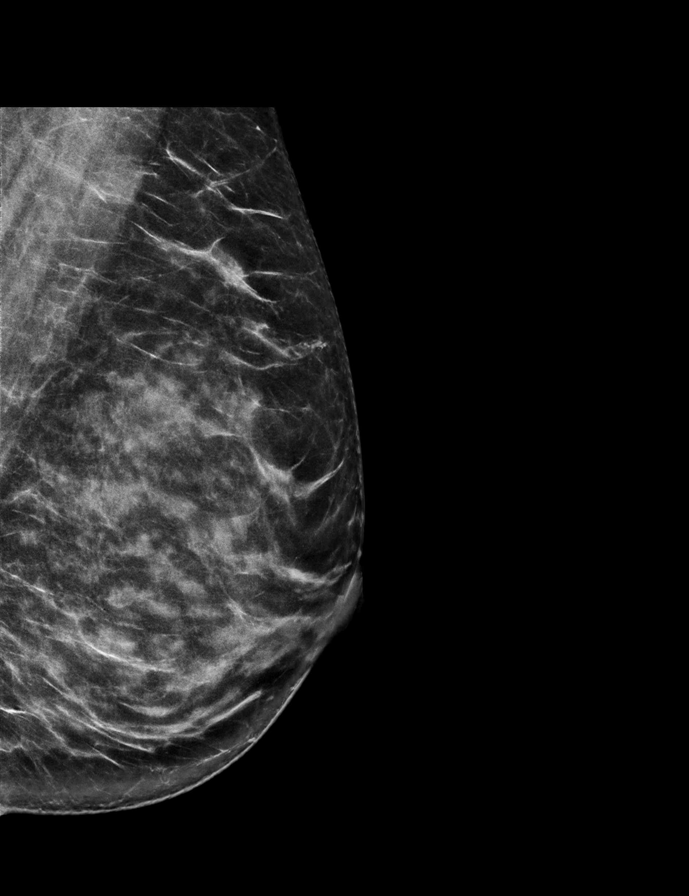

[L CC synth-2D]
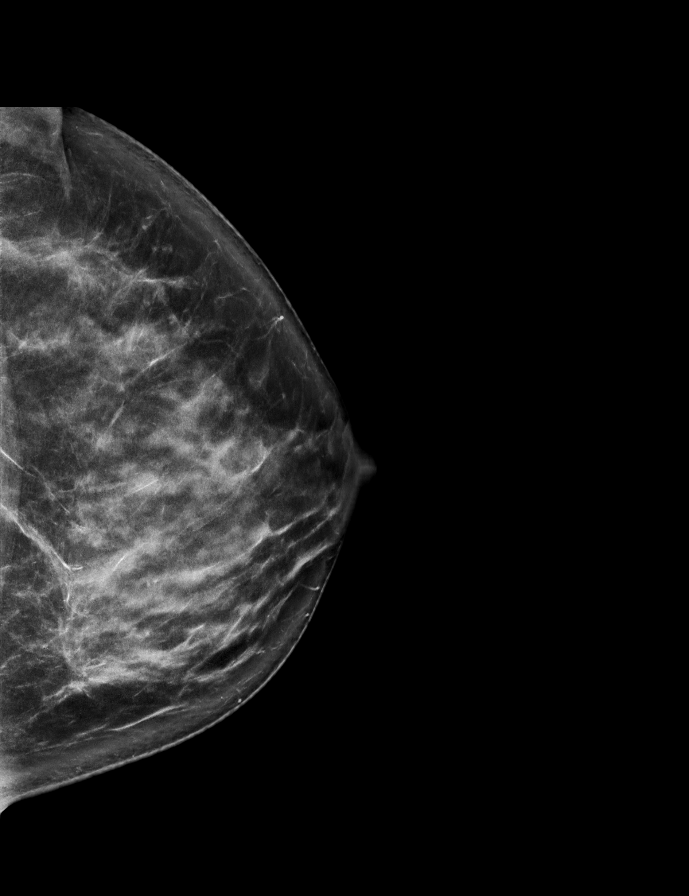

[R MLO synth-2D]
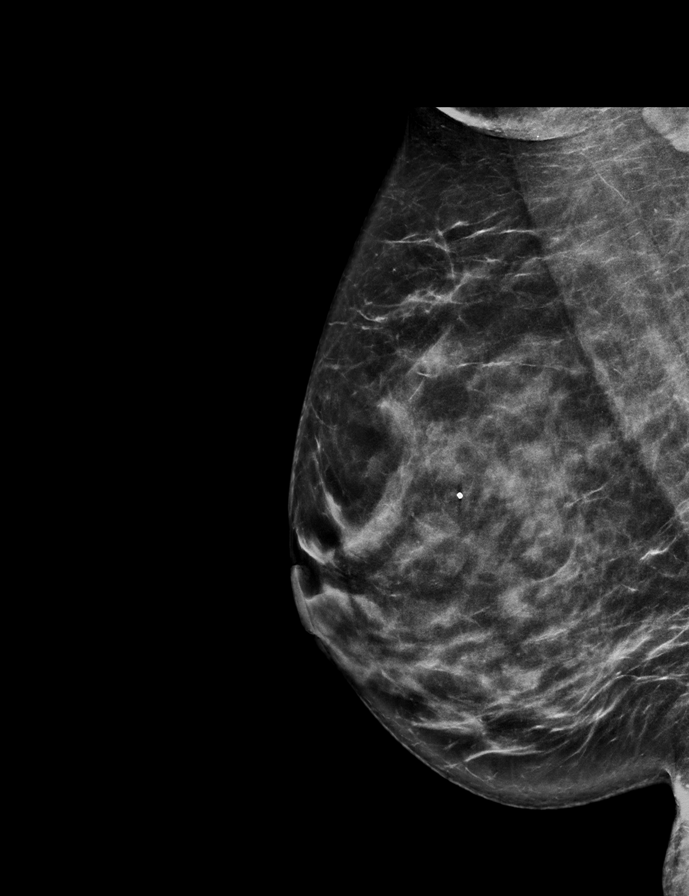

[R CC synth-2D]
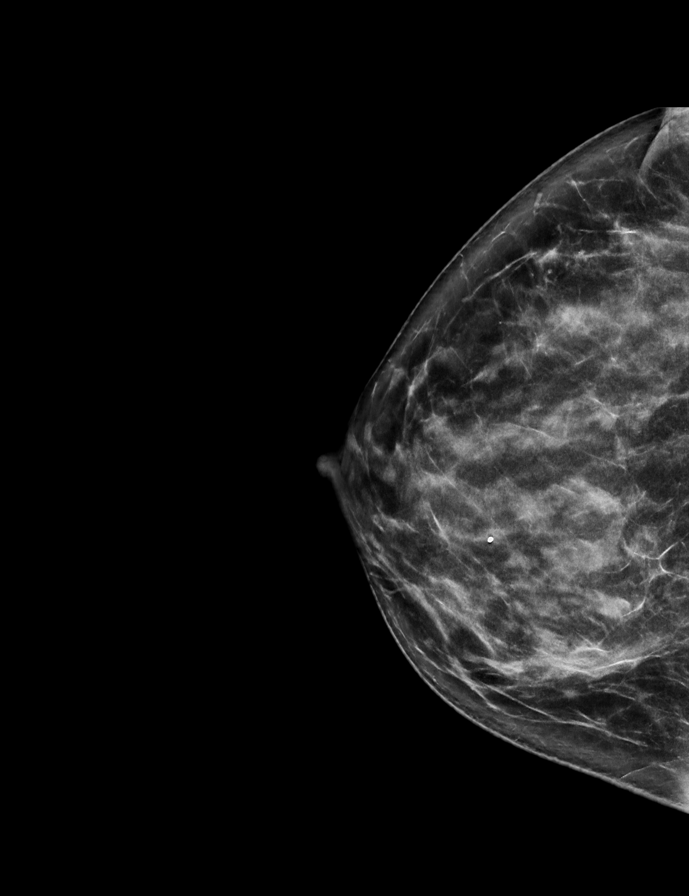

[R CC tomo · 2 of 73 frames shown]
[frame 24/73]
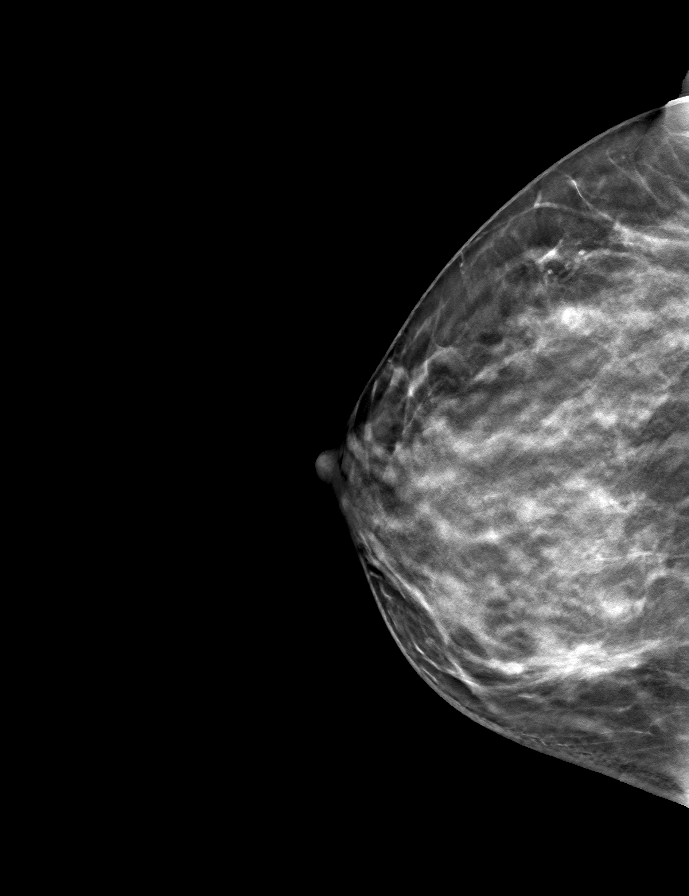
[frame 37/73]
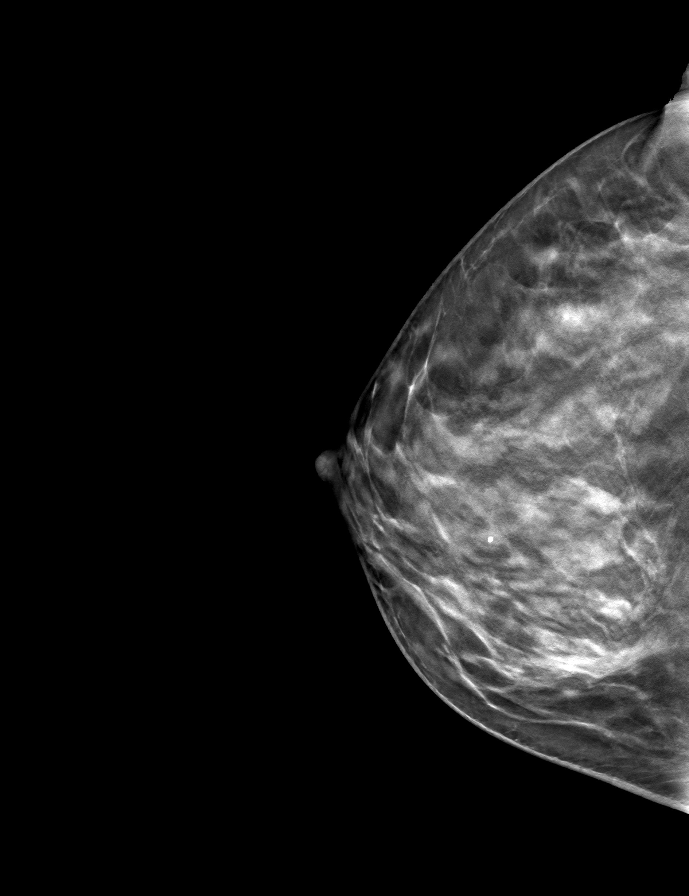

[L CC tomo · tomo slice 41/81.0]
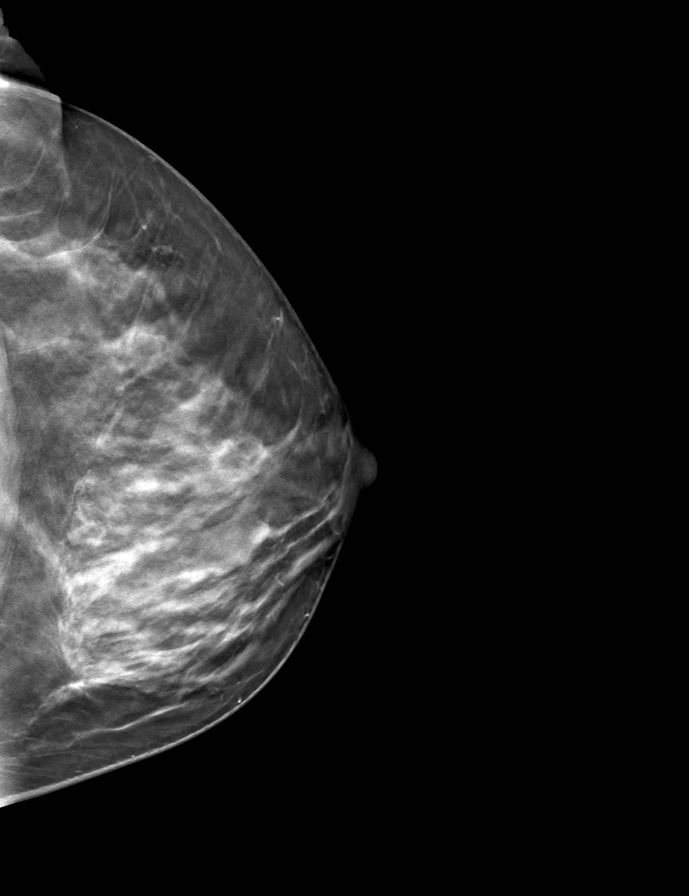

[L MLO tomo · tomo slice 35/70.0]
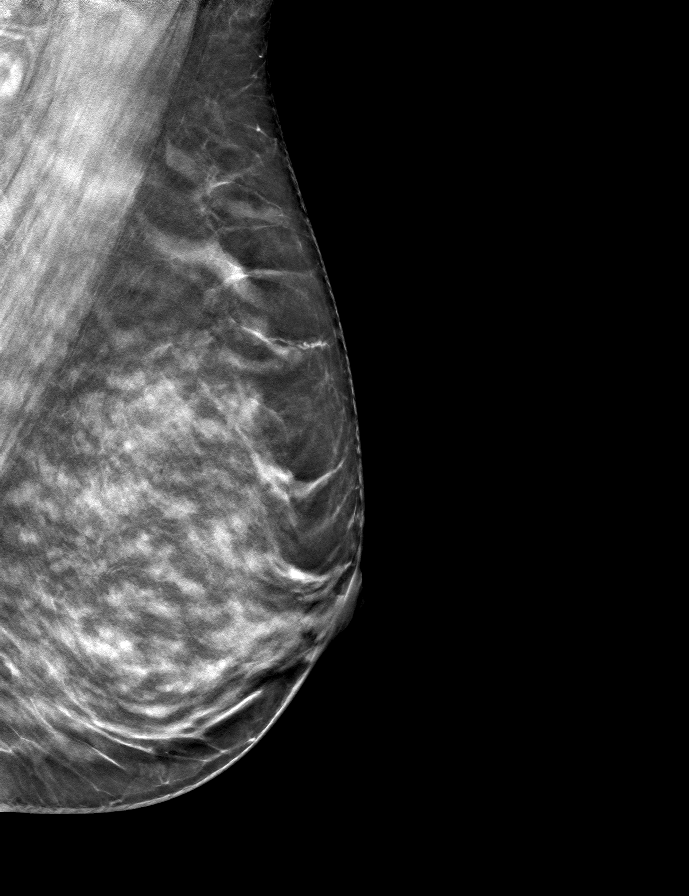

[R MLO tomo · tomo slice 37/72.0]
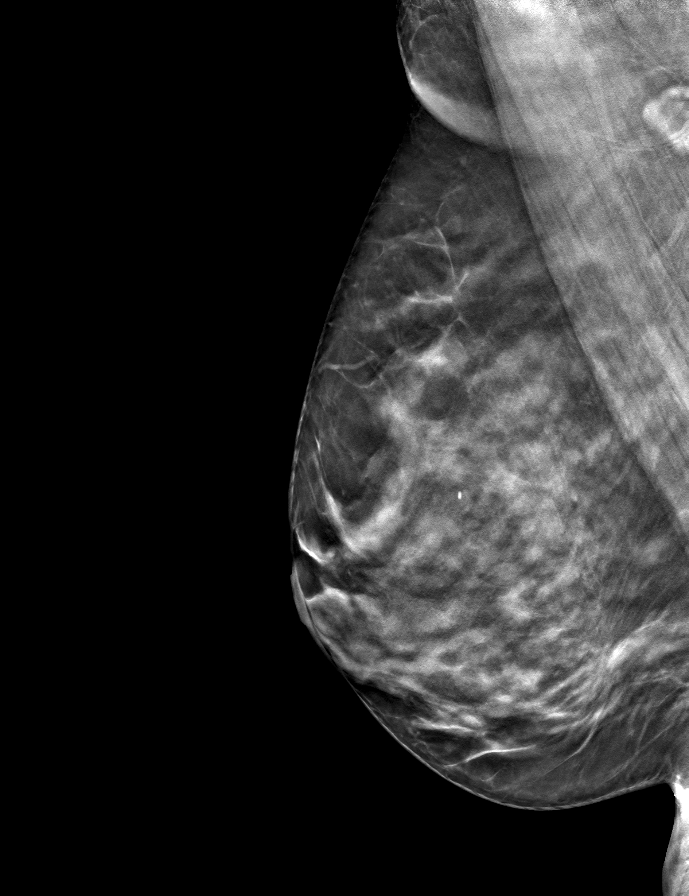

[9 of 24 positions shown; findings below may reference images not displayed]

ACR Breast Density Category c: The breast tissue is heterogeneously
dense, which may obscure small masses.
FINDINGS: There are no findings suspicious for malignancy. Images were
processed with CAD.
IMPRESSION: No mammographic evidence of malignancy. A result letter of this
screening mammogram will be mailed directly to the patient.

RECOMMENDATION:
Screening mammogram in one year. (Code:FT-U-LHB)

BI-RADS CATEGORY  1: Negative.

## 2019-12-17 ENCOUNTER — Other Ambulatory Visit: Payer: Self-pay | Admitting: Internal Medicine

## 2019-12-17 DIAGNOSIS — Z1231 Encounter for screening mammogram for malignant neoplasm of breast: Secondary | ICD-10-CM

## 2020-02-08 ENCOUNTER — Ambulatory Visit: Payer: Medicare HMO

## 2020-05-02 ENCOUNTER — Ambulatory Visit
Admission: RE | Admit: 2020-05-02 | Discharge: 2020-05-02 | Disposition: A | Discharge status: Home / Self Care | Payer: Medicare HMO | Source: Ambulatory Visit | Attending: Internal Medicine | Admitting: Internal Medicine

## 2020-05-02 ENCOUNTER — Other Ambulatory Visit: Payer: Self-pay

## 2020-05-02 DIAGNOSIS — Z1231 Encounter for screening mammogram for malignant neoplasm of breast: Secondary | ICD-10-CM

## 2020-07-15 ENCOUNTER — Emergency Department (HOSPITAL_BASED_OUTPATIENT_CLINIC_OR_DEPARTMENT_OTHER): Payer: Medicare HMO

## 2020-07-15 ENCOUNTER — Encounter (HOSPITAL_BASED_OUTPATIENT_CLINIC_OR_DEPARTMENT_OTHER): Payer: Self-pay

## 2020-07-15 ENCOUNTER — Other Ambulatory Visit: Payer: Self-pay

## 2020-07-15 ENCOUNTER — Emergency Department (HOSPITAL_BASED_OUTPATIENT_CLINIC_OR_DEPARTMENT_OTHER)
Admission: EM | Admit: 2020-07-15 | Discharge: 2020-07-16 | Disposition: A | Discharge status: Home / Self Care | Payer: Medicare HMO | Attending: Emergency Medicine | Admitting: Emergency Medicine

## 2020-07-15 DIAGNOSIS — R079 Chest pain, unspecified: Secondary | ICD-10-CM | POA: Insufficient documentation

## 2020-07-15 DIAGNOSIS — R0789 Other chest pain: Secondary | ICD-10-CM

## 2020-07-15 DIAGNOSIS — M545 Low back pain: Secondary | ICD-10-CM | POA: Insufficient documentation

## 2020-07-15 LAB — CBC
HCT: 36.8 % (ref 36.0–46.0)
Hemoglobin: 11.5 g/dL — ABNORMAL LOW (ref 12.0–15.0)
MCH: 29.6 pg (ref 26.0–34.0)
MCHC: 31.3 g/dL (ref 30.0–36.0)
MCV: 94.6 fL (ref 80.0–100.0)
Platelets: 337 10*3/uL (ref 150–400)
RBC: 3.89 MIL/uL (ref 3.87–5.11)
RDW: 12.9 % (ref 11.5–15.5)
WBC: 4.8 10*3/uL (ref 4.0–10.5)
nRBC: 0 % (ref 0.0–0.2)

## 2020-07-15 LAB — BASIC METABOLIC PANEL
Anion gap: 10 (ref 5–15)
BUN: 11 mg/dL (ref 6–20)
CO2: 25 mmol/L (ref 22–32)
Calcium: 9.2 mg/dL (ref 8.9–10.3)
Chloride: 102 mmol/L (ref 98–111)
Creatinine, Ser: 0.8 mg/dL (ref 0.44–1.00)
GFR calc Af Amer: >60 mL/min (ref >60)
GFR calc non Af Amer: >60 mL/min (ref >60)
Glucose, Bld: 92 mg/dL (ref 70–99)
Potassium: 3.5 mmol/L (ref 3.5–5.1)
Sodium: 137 mmol/L (ref 135–145)

## 2020-07-15 LAB — TROPONIN I (HIGH SENSITIVITY)
Troponin I (High Sensitivity): <2 ng/L (ref <18)
Troponin I (High Sensitivity): <2 ng/L (ref <18)

## 2020-07-15 LAB — PREGNANCY, URINE: Preg Test, Ur: NEGATIVE

## 2020-07-15 MED ORDER — KETOROLAC TROMETHAMINE 15 MG/ML IJ SOLN
15.0000 mg | Freq: Once | INTRAMUSCULAR | Status: AC
  Administered 2020-07-16: 15 mg via INTRAVENOUS
  Filled 2020-07-15: qty 1

## 2020-07-15 MED ORDER — SODIUM CHLORIDE 0.9 % IV BOLUS
1000.0000 mL | Freq: Once | INTRAVENOUS | Status: AC
  Administered 2020-07-16: 1000 mL via INTRAVENOUS

## 2020-07-15 MED ORDER — SODIUM CHLORIDE 0.9% FLUSH
3.0000 mL | Freq: Once | INTRAVENOUS | Status: DC
  Filled 2020-07-15: qty 3

## 2020-07-15 NOTE — ED Triage Notes (Signed)
C/o CP x 2 days-NAD-steady gait

## 2020-07-15 NOTE — ED Provider Notes (Signed)
[facility] EM[facility] DEPARTMENT Provider Note   CSN: [id number] Arrival date & time: 07/15/20  1811     History Chief Complaint  Patient presents with  . Chest Pain    Stefanie Wade is a 49 y.o. female.  HPI 49 year old female presents with chest pain.  Started 2 days ago.  For the most part is been constant.  It is a sharp, burning, and pressure-like pain.  It is in her left lateral chest into her shoulder as well as in her left back.  Her left arm has continuously been feeling tingly all the way down to her fingertips.  Left leg has intermittently been tingling.  No weakness in her extremities though she feels diffusely weak.  Has felt lightheaded at times as well.  No vomiting or abdominal pain.  Tried some ibuprofen earlier that did not help.  Pain is pleuritic and she does feel little short of breath.   Past Medical History:  Diagnosis Date  . Anemia   . Arthritis   . Bilateral carpal tunnel syndrome   . Chronic fatigue   . Fibromyalgia   . GERD (gastroesophageal reflux disease)   . Headache    Migraines  . Low blood pressure   . Lower extremity numbness   . Numbness and tingling of both upper extremities while sleeping   . Panic attack   . PONV (postoperative nausea and vomiting)   . PTSD (post-traumatic stress disorder)   . Shortness of breath dyspnea    with stairs  . Vertigo     Patient Active Problem List   Diagnosis Date Noted  . Right knee injury 09/17/2011    Past Surgical History:  Procedure Laterality Date  . CESAREAN SECTION    . FOOT SURGERY    . IUD REMOVAL  09/03/2016   Procedure: INTRAUTERINE DEVICE (IUD) REMOVAL;  Surgeon: Olga Millers, MD;  Location: [facility];  Service: Gynecology;;  . LAPAROSCOPIC TUBAL LIGATION N/A 09/03/2016   Procedure: LAPAROSCOPIC TUBAL LIGATION;  Surgeon: Olga Millers, MD;  Location: [facility];  Service: Gynecology;  Laterality: N/A;  FILSHIE CLIPS     OB History   No obstetric history on file.      Family History  Problem Relation Age of Onset  . Diabetes Mother   . Hypertension Mother   . Diabetes Sister   . Diabetes Maternal Grandmother   . Hypertension Maternal Grandmother   . Diabetes Paternal Grandmother   . Hypertension Paternal Grandmother   . Heart attack Neg Hx   . Hyperlipidemia Neg Hx   . Sudden death Neg Hx     Social History   Tobacco Use  . Smoking status: Never Smoker  . Smokeless tobacco: Never Used  Vaping Use  . Vaping Use: Never used  Substance Use Topics  . Alcohol use: Yes    Comment: occ  . Drug use: No    Home Medications Prior to Admission medications   Medication Sig Start Date End Date Taking? Authorizing Provider  ALPRAZolam Duanne Moron) 1 MG tablet Take 1 mg by mouth every 6 (six) hours as needed for anxiety.  05/16/17   [provider]  cetirizine (ZYRTEC) 10 MG tablet Take 10 mg by mouth daily as needed. For allergies 03/20/16   [provider]  cyclobenzaprine (FLEXERIL) 10 MG tablet Take 1 tablet (10 mg total) by mouth 2 (two) times daily as needed for muscle spasms. 11/10/18   Loura Halt A, NP  cycloSPORINE (RESTASIS) 0.05 %  ophthalmic emulsion Place 1 drop into both eyes 2 (two) times daily.    [provider]  FLUoxetine (PROZAC) 20 MG capsule Take 20 mg by mouth 2 (two) times daily.    [provider]  gabapentin (NEURONTIN) 300 MG capsule Take 300 mg by mouth 3 (three) times daily.    [provider]  ibuprofen (ADVIL,MOTRIN) 800 MG tablet Take 1 tablet (800 mg total) by mouth 3 (three) times daily. Patient not taking: Reported on 06/18/2016 03/03/16   Joy, Shawn C, PA-C  lidocaine (LIDODERM) 5 % Place 1 patch onto the skin daily. Remove & Discard patch within 12 hours or as directed by MD Patient not taking: Reported on 11/10/2018 06/03/17   Antonietta Breach, PA-C  midodrine (PROAMATINE) 10 MG tablet Take 10 mg by mouth 3 (three) times daily as needed (for blood pressure).     [provider]  ondansetron (ZOFRAN ODT) 4 MG disintegrating tablet Take 1 tablet (4 mg total) by mouth every 8 (eight) hours as needed for nausea or vomiting. Patient not taking: Reported on 06/18/2016 03/03/16   Lorayne Bender, PA-C  Oxycodone HCl 10 MG TABS Take 10 mg by mouth 3 (three) times daily. 05/16/17   [provider]    Allergies    Other  Review of Systems   Review of Systems  Constitutional: Negative for fever.  Respiratory: Positive for shortness of breath.   Cardiovascular: Positive for chest pain.  Gastrointestinal: Positive for nausea. Negative for abdominal pain and vomiting.  Musculoskeletal: Positive for back pain.  All other systems reviewed and are negative.   Physical Exam Updated Vital Signs BP 115/81 (BP Location: Right Arm)   Pulse 61   Temp 98.6 F (37 C) (Oral)   Resp 16   Ht 5\' 6"  (1.676 m)   Wt 78 kg   LMP 07/01/2020   SpO2 100%   BMI 27.76 kg/m   Physical Exam Vitals and nursing note reviewed.  Constitutional:      General: She is not in acute distress.    Appearance: She is well-developed. She is not ill-appearing or diaphoretic.  HENT:     Head: Normocephalic and atraumatic.     Right Ear: External ear normal.     Left Ear: External ear normal.     Nose: Nose normal.  Eyes:     General:        Right eye: No discharge.        Left eye: No discharge.  Cardiovascular:     Rate and Rhythm: Normal rate and regular rhythm.     Pulses:          Radial pulses are 2+ on the right side and 2+ on the left side.       Dorsalis pedis pulses are 2+ on the right side and 2+ on the left side.     Heart sounds: Normal heart sounds.  Pulmonary:     Effort: Pulmonary effort is normal.     Breath sounds: Normal breath sounds.  Chest:     Chest wall: Tenderness present.    Abdominal:     Palpations: Abdomen is soft.     Tenderness: There is no abdominal tenderness.  Musculoskeletal:     Thoracic back: Tenderness present.       Back:  Skin:     General: Skin is warm and dry.  Neurological:     Mental Status: She is alert.     Comments: 5/5  strength in all 4 extremities. Normal gross sensation in all 4 extremities.   Psychiatric:        Mood and Affect: Mood is not anxious.     ED Results / Procedures / Treatments   Labs (all labs ordered are listed, but only abnormal results are displayed) Labs Reviewed  CBC - Abnormal; Notable for the following components:      Result Value   Hemoglobin 11.5 (*)    All other components within normal limits  BASIC METABOLIC PANEL  PREGNANCY, URINE  D-DIMER, QUANTITATIVE (NOT AT [facility])  TROPONIN I (HIGH SENSITIVITY)  TROPONIN I (HIGH SENSITIVITY)    EKG EKG Interpretation  Date/Time:  Friday July 15 2020 18:37:23 EDT Ventricular Rate:  71 PR Interval:  176 QRS Duration: 68 QT Interval:  354 QTC Calculation: 384 R Axis:   29 Text Interpretation: Normal sinus rhythm no acute ST/T changes overall similar to 2019 Confirmed by Sherwood Gambler [phone]) on 07/15/2020 10:46:48 PM   Radiology DG Chest 2 View  Result Date: 07/15/2020 CLINICAL DATA:  Left-sided chest pain for 2 days EXAM: CHEST - 2 VIEW COMPARISON:  06/10/2018 FINDINGS: The heart size and mediastinal contours are within normal limits. Both lungs are clear. The visualized skeletal structures show mild degenerative change of the thoracic spine. IMPRESSION: No active cardiopulmonary disease. Electronically Signed   By: Inez Catalina M.D.   On: 07/15/2020 19:18    Procedures Procedures (including critical care time)  Medications Ordered in ED Medications  sodium chloride flush (NS) 0.9 % injection 3 mL (has no administration in time range)  sodium chloride 0.9 % bolus 1,000 mL (has no administration in time range)  ketorolac (TORADOL) 15 MG/ML injection 15 mg (has no administration in time range)    ED Course  I have reviewed the triage vital signs and the nursing notes.  Pertinent labs & imaging results that were  available during my care of the patient were reviewed by me and considered in my medical decision making (see chart for details).    MDM Rules/Calculators/A&P                          Patient's pain is probably chest wall in etiology. However, given pleuritic pain,vague tingling, a d-dimer was sent in addition to other labs. Troponins negative x 2. ECG unremarkable. Care transferred to Dr. Betsey Holiday with ddimer pending.  Final Clinical Impression(s) / ED Diagnoses Final diagnoses:  None    Rx / DC Orders ED Discharge Orders    None       Sherwood Gambler, MD 07/16/20 0009

## 2020-07-16 LAB — D-DIMER, QUANTITATIVE: D-Dimer, Quant: <0.27 ug/mL-FEU (ref 0.00–0.50)

## 2021-01-17 ENCOUNTER — Other Ambulatory Visit: Payer: Self-pay | Admitting: Neurosurgery

## 2021-02-07 NOTE — Pre-Procedure Instructions (Signed)
Jahira Swiss  02/07/2021    Your procedure is scheduled on Fri. Feb. 25, 2022 from 7:30AM-11:30AM  Report to [facility] Entrance "A" at 5:30AM  Call this number if you have problems the morning of surgery:  [phone]   Remember:  Do not eat or drink after midnight on Feb. 24th    Take these medicines the morning of surgery with A SIP OF WATER: FLUoxetine (PROZAC) Gabapentin (NEURONTIN) Oxycodone HCl  If Needed: ALPRAZolam Duanne Moron) Cetirizine (ZYRTEC)  As of today, STOP taking all Aspirin (unless instructed by your doctor) and Other Aspirin containing products, Vitamins, Fish oils, and Herbal medications. Also stop all NSAIDS i.e. Advil, Ibuprofen, Motrin, Aleve, Anaprox, Naproxen, BC, Goody Powders, and all Supplements.   No Smoking of any kind, Tobacco/Vaping, or Alcohol products 24 hours prior to your procedure. If you use a Cpap at night, you may bring all equipment for your overnight stay.    Day of Surgery:  Do not wear jewelry, make-up or nail polish.  Do not wear lotions, powders, or perfumes, or deodorant.  Do not shave 48 hours prior to surgery.    Do not bring valuables to the hospital.  [facility] is not responsible for any belongings or valuables.  Contacts, dentures or bridgework may not be worn into surgery.   For patients admitted to the hospital, discharge time will be determined by your treatment team.  Patients discharged the day of surgery will not be allowed to drive home, and someone age 20 and over needs to stay with them for 24 hours.  Special instructions:   [place]- Preparing For Surgery  Before surgery, you can play an important role. Because skin is not sterile, your skin needs to be as free of germs as possible. You can reduce the number of germs on your skin by washing with CHG (chlorahexidine gluconate) Soap before surgery.  CHG is an antiseptic cleaner which kills germs and bonds with the skin to continue killing germs  even after washing.    Oral Hygiene is also important to reduce your risk of infection.  Remember - BRUSH YOUR TEETH THE MORNING OF SURGERY WITH YOUR REGULAR TOOTHPASTE  Please do not use if you have an allergy to CHG or antibacterial soaps. If your skin becomes reddened/irritated stop using the CHG.  Do not shave (including legs and underarms) for at least 48 hours prior to first CHG shower. It is OK to shave your face.  Please follow these instructions carefully.   1. Shower the NIGHT BEFORE SURGERY and the MORNING OF SURGERY with CHG.   2. If you chose to wash your hair, wash your hair first as usual with your normal shampoo.  3. After you shampoo, rinse your hair and body thoroughly to remove the shampoo.  4. Use CHG as you would any other liquid soap. You can apply CHG directly to the skin and wash gently with a scrungie or a clean washcloth.   5. Apply the CHG Soap to your body ONLY FROM THE NECK DOWN.  Do not use on open wounds or open sores. Avoid contact with your eyes, ears, mouth and genitals (private parts). Wash Face and genitals (private parts)  with your normal soap.  6. Wash thoroughly, paying special attention to the area where your surgery will be performed.  7. Thoroughly rinse your body with warm water from the neck down.  8. DO NOT shower/wash with your normal soap after using and rinsing off the  CHG Soap.  9. Pat yourself dry with a CLEAN TOWEL.  10. Wear CLEAN PAJAMAS to bed the night before surgery, wear comfortable clothes the morning of surgery  11. Place CLEAN SHEETS on your bed the night of your first shower and DO NOT SLEEP WITH PETS.  Reminders: Do not apply any deodorants/lotions.  Please wear clean clothes to the hospital/surgery center.   Remember to brush your teeth WITH YOUR REGULAR TOOTHPASTE.  Please read over the following fact sheets that you were given.

## 2021-02-08 ENCOUNTER — Encounter (HOSPITAL_COMMUNITY): Payer: Self-pay

## 2021-02-08 ENCOUNTER — Other Ambulatory Visit (HOSPITAL_COMMUNITY)
Admission: RE | Admit: 2021-02-08 | Discharge: 2021-02-08 | Disposition: A | Discharge status: Home / Self Care | Payer: Medicare HMO | Source: Ambulatory Visit | Attending: Neurosurgery | Admitting: Neurosurgery

## 2021-02-08 ENCOUNTER — Encounter (HOSPITAL_COMMUNITY)
Admission: RE | Admit: 2021-02-08 | Discharge: 2021-02-08 | Disposition: A | Discharge status: Home / Self Care | Payer: Medicare HMO | Source: Ambulatory Visit | Attending: Neurosurgery | Admitting: Neurosurgery

## 2021-02-08 ENCOUNTER — Other Ambulatory Visit: Payer: Self-pay

## 2021-02-08 DIAGNOSIS — Z79899 Other long term (current) drug therapy: Secondary | ICD-10-CM | POA: Insufficient documentation

## 2021-02-08 DIAGNOSIS — R0600 Dyspnea, unspecified: Secondary | ICD-10-CM | POA: Insufficient documentation

## 2021-02-08 DIAGNOSIS — Z20822 Contact with and (suspected) exposure to covid-19: Secondary | ICD-10-CM | POA: Insufficient documentation

## 2021-02-08 DIAGNOSIS — Z79891 Long term (current) use of opiate analgesic: Secondary | ICD-10-CM | POA: Insufficient documentation

## 2021-02-08 DIAGNOSIS — F4310 Post-traumatic stress disorder, unspecified: Secondary | ICD-10-CM | POA: Insufficient documentation

## 2021-02-08 DIAGNOSIS — R202 Paresthesia of skin: Secondary | ICD-10-CM | POA: Insufficient documentation

## 2021-02-08 DIAGNOSIS — R5382 Chronic fatigue, unspecified: Secondary | ICD-10-CM | POA: Insufficient documentation

## 2021-02-08 DIAGNOSIS — K219 Gastro-esophageal reflux disease without esophagitis: Secondary | ICD-10-CM | POA: Insufficient documentation

## 2021-02-08 DIAGNOSIS — R42 Dizziness and giddiness: Secondary | ICD-10-CM | POA: Insufficient documentation

## 2021-02-08 DIAGNOSIS — I951 Orthostatic hypotension: Secondary | ICD-10-CM | POA: Insufficient documentation

## 2021-02-08 DIAGNOSIS — E220 Acromegaly and pituitary gigantism: Secondary | ICD-10-CM | POA: Insufficient documentation

## 2021-02-08 DIAGNOSIS — Z01812 Encounter for preprocedural laboratory examination: Secondary | ICD-10-CM | POA: Insufficient documentation

## 2021-02-08 DIAGNOSIS — M797 Fibromyalgia: Secondary | ICD-10-CM | POA: Insufficient documentation

## 2021-02-08 DIAGNOSIS — F32A Depression, unspecified: Secondary | ICD-10-CM | POA: Insufficient documentation

## 2021-02-08 DIAGNOSIS — D509 Iron deficiency anemia, unspecified: Secondary | ICD-10-CM | POA: Insufficient documentation

## 2021-02-08 DIAGNOSIS — Z8669 Personal history of other diseases of the nervous system and sense organs: Secondary | ICD-10-CM | POA: Insufficient documentation

## 2021-02-08 HISTORY — DX: Personal history of urinary calculi: Z87.442

## 2021-02-08 HISTORY — DX: Depression, unspecified: F32.A

## 2021-02-08 LAB — CBC
HCT: 33 % — ABNORMAL LOW (ref 36.0–46.0)
Hemoglobin: 10.7 g/dL — ABNORMAL LOW (ref 12.0–15.0)
MCH: 30.4 pg (ref 26.0–34.0)
MCHC: 32.4 g/dL (ref 30.0–36.0)
MCV: 93.8 fL (ref 80.0–100.0)
Platelets: 300 10*3/uL (ref 150–400)
RBC: 3.52 MIL/uL — ABNORMAL LOW (ref 3.87–5.11)
RDW: 12.7 % (ref 11.5–15.5)
WBC: 3.8 10*3/uL — ABNORMAL LOW (ref 4.0–10.5)
nRBC: 0 % (ref 0.0–0.2)

## 2021-02-08 LAB — TYPE AND SCREEN
ABO/RH(D): AB POS
Antibody Screen: NEGATIVE

## 2021-02-08 LAB — BASIC METABOLIC PANEL
Anion gap: 9 (ref 5–15)
BUN: 7 mg/dL (ref 6–20)
CO2: 24 mmol/L (ref 22–32)
Calcium: 8.8 mg/dL — ABNORMAL LOW (ref 8.9–10.3)
Chloride: 104 mmol/L (ref 98–111)
Creatinine, Ser: 0.82 mg/dL (ref 0.44–1.00)
GFR, Estimated: >60 mL/min (ref >60)
Glucose, Bld: 88 mg/dL (ref 70–99)
Potassium: 3.9 mmol/L (ref 3.5–5.1)
Sodium: 137 mmol/L (ref 135–145)

## 2021-02-08 LAB — SARS CORONAVIRUS 2 (TAT 6-24 HRS): SARS Coronavirus 2: NEGATIVE

## 2021-02-08 NOTE — Progress Notes (Addendum)
PCP - Dr. Gala Romney Cardiologist - Dr. Kela Millin  PPM/ICD - denies  Chest x-ray - N/A EKG - 07/15/2020 Stress Test/ECHO -  Per patient done about 3-4 years ago (requested) Cardiac Cath - denies  Sleep Study - denies  CPAP - N/A  DM: N/A  Blood Thinner Instructions: N/A Aspirin Instructions: per patient last dose taken on 01/02/2021  ERAS Protcol - No  COVID TEST- Scheduled for today 02/08/2021. [facility]  Anesthesia review:  YES, cardiac and PCP records requested  Patient stated saw cardiologist 3-4 years ago was told "had a tear in valve or leakage, nothing to be done at the time" Patient stated had and ECHO and stress done as well. Patient stated has low blood pressure "gets as low as 80/90 top number"  Patient denies shortness of breath, fever, cough and chest pain at PAT appointment  All instructions explained to the patient, with a verbal understanding of the material. Patient agrees to go over the instructions while at home for a better understanding. Patient also instructed to self quarantine after being tested for COVID-19. The opportunity to ask questions was provided.

## 2021-02-09 ENCOUNTER — Encounter (HOSPITAL_COMMUNITY): Payer: Self-pay

## 2021-02-09 NOTE — Progress Notes (Signed)
Anesthesia Chart Review:  Case: [id number] Date/Time: 02/10/21 0715   Procedures:      Transspheniodal resection of pituitary tumor (N/A )     TRANSPHENOIDAL APPROACH EXPOSURE (N/A )   Anesthesia type: General   Pre-op diagnosis: Pituitary Tumor   Location: [place] [facility] ROOM 19 / [place] [facility]   Surgeons: Ashok Pall, MD; Jerrell Belfast, MD      DISCUSSION: Patient is a 50 year old female scheduled for the above procedure.  History includes never smoker, post-operative N/V, hypotension (orthostatic hypotension; midodrine as needed), fibromyalgia, PTSD, panic attacks, GERD, migraines, chronic fatigue, vertigo, depression, paresthesias (LE), exertinoal dyspnea, anemia, hypotension, tubal ligation (09/03/16).  02/08/21 presurgical COVID-19 test negative. For urine pregnancy test on arrival. Anesthesia team to evaluate on the day of surgery.    VS: BP 108/76   Pulse 72   Temp 36.9 C (Oral)   Resp 18   Ht 5\' 6"  (1.676 m)   LMP 01/10/2021 (Approximate)   SpO2 100%   BMI 27.76 kg/m   PROVIDERS: Elwyn Reach, MD is PCP  - She is not followed routinely by cardiology but was evaluated by Adrian Prows, MD in 2018 for atypical chest pain. Negative stress test. "Very minimal mitral prolapse with very mild mitral regurg" on echo. Exacerbation of fibromyalgia suspected. She was referred to rheumatologist Dr. Estanislado Pandy.     LABS: Labs reviewed: Acceptable for surgery. WBC 3.8, but appears consistent with previous results (3.6-4.8 since 2017). H/H 10.7/33.0, previously 11.5/36.8 on 07/15/20. T&S done with preoperative labs.  (all labs ordered are listed, but only abnormal results are displayed)  Labs Reviewed  BASIC METABOLIC PANEL - Abnormal; Notable for the following components:      Result Value   Calcium 8.8 (*)    All other components within normal limits  CBC - Abnormal; Notable for the following components:   WBC 3.8 (*)    RBC 3.52 (*)    Hemoglobin 10.7 (*)    HCT 33.0 (*)    All other  components within normal limits  TYPE AND SCREEN     IMAGES: CT Paranasal sinuses 01/16/21 [facility]): IMPRESSION: - 12 mm left maxillary sinus mucous retention cyst. - The paranasal sinuses are otherwise normally aerated. Patent sinus drainage pathways. - Sellar sphenoid pneumatization. - Known pituitary lesion better appreciated on the prior outside brain MRI of 12/20/2020.  MRI Brain 12/20/20 ([facility]): IMPRESSION:  1.3 x 1.4 x 1.3 cm heterogeneous sellar mass with extension in the suprasellar region with mass effect upon the optic chasm. There may be a fluid-fluid level. This may represent an adenoma and recommend MRI of the sella (protocol) with intravenous contrast for further evaluation.    CXR 07/15/20: FINDINGS: The heart size and mediastinal contours are within normal limits. Both lungs are clear. The visualized skeletal structures show mild degenerative change of the thoracic spine. IMPRESSION: No active cardiopulmonary disease.   EKG: 07/15/20: Normal sinus rhythm no acute ST/T changes overall similar to 2019 Confirmed by Sherwood Gambler [phone]) on 07/15/2020 10:46:48 PM  CV: Echo 07/18/17: Conclusions: 1.  Left ventricle cavity is normal in size.  Mild to moderate concentric hypertrophy of the left ventricle.  Normal global wall motion.  Normal diastolic filling pattern.  Calculated EF 55%.   2.  Borderline mitral valve prolapse.  No significant myxomatous degeneration.  Mild (grade 1) mitral regurgitation. 3.  Trace tricuspid regurgitation.  Trace pulmonic regurgitation. 4.  Compared to the study done on 01/15/2011, no significant change.  Previously trace mitral regurgitation.  Borderline MVP new.   Nuclear stress test  Past Medical History:  Diagnosis Date  . Anemia   . Arthritis   . Bilateral carpal tunnel syndrome   . Chronic fatigue   . Depression   . Fibromyalgia   . GERD (gastroesophageal reflux disease)   . Headache    Migraines  . History of  kidney stones   . Low blood pressure   . Lower extremity numbness   . Numbness and tingling of both upper extremities while sleeping   . Panic attack   . PONV (postoperative nausea and vomiting)   . PTSD (post-traumatic stress disorder)   . Shortness of breath dyspnea    with stairs  . Vertigo     Past Surgical History:  Procedure Laterality Date  . CESAREAN SECTION    . FOOT SURGERY    . IUD REMOVAL  09/03/2016   Procedure: INTRAUTERINE DEVICE (IUD) REMOVAL;  Surgeon: Olga Millers, MD;  Location: [place] [facility];  Service: Gynecology;;  . LAPAROSCOPIC TUBAL LIGATION N/A 09/03/2016   Procedure: LAPAROSCOPIC TUBAL LIGATION;  Surgeon: Olga Millers, MD;  Location: [place] [facility];  Service: Gynecology;  Laterality: N/A;  FILSHIE CLIPS    MEDICATIONS: . ALPRAZolam (XANAX) 1 MG tablet  . cetirizine (ZYRTEC) 10 MG tablet  . Cholecalciferol (VITAMIN D) 50 MCG (2000 UT) tablet  . cyclobenzaprine (FLEXERIL) 10 MG tablet  . FLUoxetine (PROZAC) 20 MG capsule  . gabapentin (NEURONTIN) 300 MG capsule  . midodrine (PROAMATINE) 10 MG tablet  . Multiple Vitamins-Minerals (MULTIVITAMIN WITH MINERALS) tablet  . Oxycodone HCl 10 MG TABS  . vitamin B-12 (CYANOCOBALAMIN) 1000 MCG tablet  . zinc gluconate 50 MG tablet   No current facility-administered medications for this encounter.    Myra Gianotti, PA-C Surgical Short Stay/Anesthesiology [facility] Phone [phone] [facility] Phone [phone] 02/09/2021 4:24 PM

## 2021-02-09 NOTE — Anesthesia Preprocedure Evaluation (Addendum)
Anesthesia Evaluation  Patient identified by MRN, date of birth, ID band Patient awake    Reviewed: Allergy & Precautions, NPO status , Patient's Chart, lab work & pertinent test results  History of Anesthesia Complications (+) PONV  Airway Mallampati: III  TM Distance: >3 FB Neck ROM: Full    Dental  (+) Dental Advisory Given   Pulmonary neg pulmonary ROS,    breath sounds clear to auscultation       Cardiovascular negative cardio ROS   Rhythm:Regular Rate:Normal     Neuro/Psych  Headaches, Pituitary tumor  Neuromuscular disease    GI/Hepatic Neg liver ROS, GERD  ,  Endo/Other  negative endocrine ROS  Renal/GU negative Renal ROS     Musculoskeletal  (+) Arthritis , Fibromyalgia -  Abdominal   Peds  Hematology  (+) anemia ,   Anesthesia Other Findings   Reproductive/Obstetrics                            Anesthesia Physical Anesthesia Plan  ASA: III  Anesthesia Plan: General   Post-op Pain Management:    Induction: Intravenous  PONV Risk Score and Plan: 4 or greater and Dexamethasone, Ondansetron, Midazolam, Treatment may vary due to age or medical condition, Scopolamine patch - Pre-op and Propofol infusion  Airway Management Planned: Oral ETT  Additional Equipment: Arterial line  Intra-op Plan:   Post-operative Plan: Extubation in OR  Informed Consent: I have reviewed the patients History and Physical, chart, labs and discussed the procedure including the risks, benefits and alternatives for the proposed anesthesia with the patient or authorized representative who has indicated his/her understanding and acceptance.     Dental advisory given  Plan Discussed with: CRNA  Anesthesia Plan Comments: (GETA. A-line, 2IV's. Remifentanil gtt. Propofol gtt. )      Anesthesia Quick Evaluation

## 2021-02-10 ENCOUNTER — Encounter (HOSPITAL_COMMUNITY): Payer: Self-pay | Admitting: Neurosurgery

## 2021-02-10 ENCOUNTER — Inpatient Hospital Stay (HOSPITAL_COMMUNITY)
Admission: RE | Admit: 2021-02-10 | Discharge: 2021-02-11 | DRG: 615 | Disposition: A | Discharge status: Home / Self Care | Payer: Medicare HMO | Attending: Neurosurgery | Admitting: Neurosurgery

## 2021-02-10 ENCOUNTER — Encounter (HOSPITAL_COMMUNITY)
Admission: RE | Disposition: A | Discharge status: Home / Self Care | Payer: Self-pay | Source: Home / Self Care | Attending: Neurosurgery

## 2021-02-10 ENCOUNTER — Other Ambulatory Visit: Payer: Self-pay

## 2021-02-10 ENCOUNTER — Inpatient Hospital Stay (HOSPITAL_COMMUNITY): Payer: Medicare HMO | Admitting: Anesthesiology

## 2021-02-10 ENCOUNTER — Inpatient Hospital Stay (HOSPITAL_COMMUNITY): Payer: Medicare HMO | Admitting: Vascular Surgery

## 2021-02-10 DIAGNOSIS — F4310 Post-traumatic stress disorder, unspecified: Secondary | ICD-10-CM | POA: Diagnosis present

## 2021-02-10 DIAGNOSIS — Z79899 Other long term (current) drug therapy: Secondary | ICD-10-CM

## 2021-02-10 DIAGNOSIS — F410 Panic disorder [episodic paroxysmal anxiety] without agoraphobia: Secondary | ICD-10-CM | POA: Diagnosis present

## 2021-02-10 DIAGNOSIS — K219 Gastro-esophageal reflux disease without esophagitis: Secondary | ICD-10-CM | POA: Diagnosis present

## 2021-02-10 DIAGNOSIS — M797 Fibromyalgia: Secondary | ICD-10-CM | POA: Diagnosis present

## 2021-02-10 DIAGNOSIS — D497 Neoplasm of unspecified behavior of endocrine glands and other parts of nervous system: Secondary | ICD-10-CM | POA: Diagnosis present

## 2021-02-10 DIAGNOSIS — E893 Postprocedural hypopituitarism: Secondary | ICD-10-CM

## 2021-02-10 DIAGNOSIS — Z20822 Contact with and (suspected) exposure to covid-19: Secondary | ICD-10-CM | POA: Diagnosis present

## 2021-02-10 DIAGNOSIS — D352 Benign neoplasm of pituitary gland: Secondary | ICD-10-CM | POA: Diagnosis present

## 2021-02-10 HISTORY — PX: CRANIOTOMY: SHX93

## 2021-02-10 HISTORY — PX: TRANSPHENOIDAL APPROACH EXPOSURE: SHX6311

## 2021-02-10 LAB — CBC
HCT: 34.8 % — ABNORMAL LOW (ref 36.0–46.0)
Hemoglobin: 10.9 g/dL — ABNORMAL LOW (ref 12.0–15.0)
MCH: 29.3 pg (ref 26.0–34.0)
MCHC: 31.3 g/dL (ref 30.0–36.0)
MCV: 93.5 fL (ref 80.0–100.0)
Platelets: 279 10*3/uL (ref 150–400)
RBC: 3.72 MIL/uL — ABNORMAL LOW (ref 3.87–5.11)
RDW: 12.6 % (ref 11.5–15.5)
WBC: 4.6 10*3/uL (ref 4.0–10.5)
nRBC: 0 % (ref 0.0–0.2)

## 2021-02-10 LAB — CREATININE, SERUM
Creatinine, Ser: 0.85 mg/dL (ref 0.44–1.00)
GFR, Estimated: >60 mL/min (ref >60)

## 2021-02-10 LAB — ABO/RH: ABO/RH(D): AB POS

## 2021-02-10 LAB — POCT PREGNANCY, URINE: Preg Test, Ur: NEGATIVE

## 2021-02-10 SURGERY — CRANIOTOMY HYPOPHYSECTOMY TRANSNASAL APPROACH
Anesthesia: General

## 2021-02-10 MED ORDER — CHLORHEXIDINE GLUCONATE CLOTH 2 % EX PADS: 6.0000 | MEDICATED_PAD | Freq: Once | CUTANEOUS | Status: DC

## 2021-02-10 MED ORDER — SCOPOLAMINE 1 MG/3DAYS TD PT72
MEDICATED_PATCH | TRANSDERMAL | Status: AC
  Filled 2021-02-10: qty 1

## 2021-02-10 MED ORDER — LIDOCAINE-EPINEPHRINE 1 %-1:100000 IJ SOLN
INTRAMUSCULAR | Status: DC | PRN
  Administered 2021-02-10: 7 mL

## 2021-02-10 MED ORDER — POTASSIUM CHLORIDE IN NACL 20-0.9 MEQ/L-% IV SOLN
INTRAVENOUS | Status: DC
  Filled 2021-02-10 (×2): qty 1000

## 2021-02-10 MED ORDER — DEXAMETHASONE SODIUM PHOSPHATE 10 MG/ML IJ SOLN
INTRAMUSCULAR | Status: AC
  Filled 2021-02-10: qty 1

## 2021-02-10 MED ORDER — LACTATED RINGERS IV SOLN: INTRAVENOUS | Status: DC

## 2021-02-10 MED ORDER — MIDAZOLAM HCL 2 MG/2ML IJ SOLN
INTRAMUSCULAR | Status: AC
  Filled 2021-02-10: qty 2

## 2021-02-10 MED ORDER — CHLORHEXIDINE GLUCONATE CLOTH 2 % EX PADS
6.0000 | MEDICATED_PAD | Freq: Every day | CUTANEOUS | Status: DC
  Administered 2021-02-11: 6 via TOPICAL

## 2021-02-10 MED ORDER — LABETALOL HCL 5 MG/ML IV SOLN: 10.0000 mg | INTRAVENOUS | Status: DC | PRN

## 2021-02-10 MED ORDER — ROCURONIUM BROMIDE 10 MG/ML (PF) SYRINGE
PREFILLED_SYRINGE | INTRAVENOUS | Status: DC | PRN
  Administered 2021-02-10: 60 mg via INTRAVENOUS
  Administered 2021-02-10: 40 mg via INTRAVENOUS

## 2021-02-10 MED ORDER — ONDANSETRON HCL 4 MG/2ML IJ SOLN: 4.0000 mg | INTRAMUSCULAR | Status: DC | PRN

## 2021-02-10 MED ORDER — FENTANYL CITRATE (PF) 250 MCG/5ML IJ SOLN
INTRAMUSCULAR | Status: DC | PRN
  Administered 2021-02-10 (×4): 50 ug via INTRAVENOUS

## 2021-02-10 MED ORDER — FLUOXETINE HCL 20 MG PO CAPS
20.0000 mg | ORAL_CAPSULE | Freq: Two times a day (BID) | ORAL | Status: DC
  Filled 2021-02-10 (×3): qty 1

## 2021-02-10 MED ORDER — HYDROCORTISONE 20 MG PO TABS
50.0000 mg | ORAL_TABLET | Freq: Three times a day (TID) | ORAL | Status: DC
  Administered 2021-02-10 – 2021-02-11 (×3): 50 mg via ORAL
  Filled 2021-02-10 (×5): qty 1

## 2021-02-10 MED ORDER — SODIUM CHLORIDE 0.9 % IR SOLN
Status: DC | PRN
  Administered 2021-02-10: 1000 mL

## 2021-02-10 MED ORDER — CEFAZOLIN SODIUM-DEXTROSE 2-4 GM/100ML-% IV SOLN
2.0000 g | INTRAVENOUS | Status: AC
  Administered 2021-02-10: 2 g via INTRAVENOUS
  Filled 2021-02-10: qty 100

## 2021-02-10 MED ORDER — THROMBIN 5000 UNITS EX SOLR
CUTANEOUS | Status: AC
  Filled 2021-02-10: qty 10000

## 2021-02-10 MED ORDER — CHLORHEXIDINE GLUCONATE 0.12 % MT SOLN
15.0000 mL | Freq: Once | OROMUCOSAL | Status: AC
  Administered 2021-02-10: 15 mL via OROMUCOSAL
  Filled 2021-02-10: qty 15

## 2021-02-10 MED ORDER — PROPOFOL 1000 MG/100ML IV EMUL
INTRAVENOUS | Status: AC
  Filled 2021-02-10: qty 100

## 2021-02-10 MED ORDER — ALPRAZOLAM 0.5 MG PO TABS: 1.0000 mg | ORAL_TABLET | Freq: Four times a day (QID) | ORAL | Status: DC | PRN

## 2021-02-10 MED ORDER — ORAL CARE MOUTH RINSE: 15.0000 mL | Freq: Once | OROMUCOSAL | Status: AC

## 2021-02-10 MED ORDER — AMISULPRIDE (ANTIEMETIC) 5 MG/2ML IV SOLN: 10.0000 mg | Freq: Once | INTRAVENOUS | Status: DC | PRN

## 2021-02-10 MED ORDER — ROCURONIUM BROMIDE 10 MG/ML (PF) SYRINGE
PREFILLED_SYRINGE | INTRAVENOUS | Status: AC
  Filled 2021-02-10: qty 10

## 2021-02-10 MED ORDER — ONDANSETRON HCL 4 MG PO TABS: 4.0000 mg | ORAL_TABLET | ORAL | Status: DC | PRN

## 2021-02-10 MED ORDER — FENTANYL CITRATE (PF) 100 MCG/2ML IJ SOLN
25.0000 ug | INTRAMUSCULAR | Status: DC | PRN
Start: 2021-02-10 — End: 2021-02-10

## 2021-02-10 MED ORDER — MUPIROCIN CALCIUM 2 % EX CREA
TOPICAL_CREAM | CUTANEOUS | Status: DC | PRN
  Administered 2021-02-10: 1 via TOPICAL

## 2021-02-10 MED ORDER — VITAMIN D 25 MCG (1000 UNIT) PO TABS
2000.0000 [IU] | ORAL_TABLET | Freq: Every day | ORAL | Status: DC
  Administered 2021-02-10 – 2021-02-11 (×2): 2000 [IU] via ORAL
  Filled 2021-02-10 (×2): qty 2

## 2021-02-10 MED ORDER — CYCLOBENZAPRINE HCL 10 MG PO TABS: 10.0000 mg | ORAL_TABLET | Freq: Two times a day (BID) | ORAL | Status: DC | PRN

## 2021-02-10 MED ORDER — LIDOCAINE-EPINEPHRINE 1 %-1:100000 IJ SOLN
INTRAMUSCULAR | Status: AC
  Filled 2021-02-10: qty 1

## 2021-02-10 MED ORDER — THROMBIN 5000 UNITS EX SOLR
CUTANEOUS | Status: DC | PRN
  Administered 2021-02-10 (×2): 5000 [IU] via TOPICAL

## 2021-02-10 MED ORDER — CEPHALEXIN 500 MG PO CAPS: 500.0000 mg | ORAL_CAPSULE | Freq: Three times a day (TID) | ORAL | 0 refills | Status: AC

## 2021-02-10 MED ORDER — HEMOSTATIC AGENTS (NO CHARGE) OPTIME
TOPICAL | Status: DC | PRN
  Administered 2021-02-10: 1 via TOPICAL

## 2021-02-10 MED ORDER — PROPOFOL 10 MG/ML IV BOLUS
INTRAVENOUS | Status: AC
  Filled 2021-02-10: qty 20

## 2021-02-10 MED ORDER — NALOXONE HCL 0.4 MG/ML IJ SOLN: 0.0800 mg | INTRAMUSCULAR | Status: DC | PRN

## 2021-02-10 MED ORDER — TRIAMCINOLONE ACETONIDE 40 MG/ML IJ SUSP
INTRAMUSCULAR | Status: AC
  Filled 2021-02-10: qty 5

## 2021-02-10 MED ORDER — LIDOCAINE 2% (20 MG/ML) 5 ML SYRINGE
INTRAMUSCULAR | Status: DC | PRN
  Administered 2021-02-10: 80 mg via INTRAVENOUS

## 2021-02-10 MED ORDER — PANTOPRAZOLE SODIUM 40 MG IV SOLR
40.0000 mg | Freq: Every day | INTRAVENOUS | Status: DC
  Administered 2021-02-10: 40 mg via INTRAVENOUS
  Filled 2021-02-10: qty 40

## 2021-02-10 MED ORDER — OXYMETAZOLINE HCL 0.05 % NA SOLN
NASAL | Status: AC
  Filled 2021-02-10: qty 30

## 2021-02-10 MED ORDER — 0.9 % SODIUM CHLORIDE (POUR BTL) OPTIME
TOPICAL | Status: DC | PRN
  Administered 2021-02-10: 1000 mL

## 2021-02-10 MED ORDER — ONDANSETRON HCL 4 MG/2ML IJ SOLN
INTRAMUSCULAR | Status: DC | PRN
  Administered 2021-02-10: 4 mg via INTRAVENOUS

## 2021-02-10 MED ORDER — THROMBIN 5000 UNITS EX SOLR
CUTANEOUS | Status: AC
  Filled 2021-02-10: qty 5000

## 2021-02-10 MED ORDER — CEFAZOLIN SODIUM-DEXTROSE 1-4 GM/50ML-% IV SOLN
1.0000 g | Freq: Three times a day (TID) | INTRAVENOUS | Status: AC
  Administered 2021-02-10 – 2021-02-11 (×3): 1 g via INTRAVENOUS
  Filled 2021-02-10 (×3): qty 50

## 2021-02-10 MED ORDER — ADULT MULTIVITAMIN W/MINERALS CH
1.0000 | ORAL_TABLET | Freq: Every day | ORAL | Status: DC
  Administered 2021-02-10 – 2021-02-11 (×2): 1 via ORAL
  Filled 2021-02-10 (×2): qty 1

## 2021-02-10 MED ORDER — ONDANSETRON HCL 4 MG/2ML IJ SOLN
INTRAMUSCULAR | Status: AC
  Filled 2021-02-10: qty 2

## 2021-02-10 MED ORDER — OXYMETAZOLINE HCL 0.05 % NA SOLN
NASAL | Status: DC | PRN
  Administered 2021-02-10: 1 via TOPICAL

## 2021-02-10 MED ORDER — MICROFIBRILLAR COLL HEMOSTAT EX PADS
MEDICATED_PAD | CUTANEOUS | Status: DC | PRN
  Administered 2021-02-10: 1 via TOPICAL

## 2021-02-10 MED ORDER — FENTANYL CITRATE (PF) 250 MCG/5ML IJ SOLN
INTRAMUSCULAR | Status: AC
  Filled 2021-02-10: qty 5

## 2021-02-10 MED ORDER — LIDOCAINE 2% (20 MG/ML) 5 ML SYRINGE
INTRAMUSCULAR | Status: AC
  Filled 2021-02-10: qty 5

## 2021-02-10 MED ORDER — MORPHINE SULFATE (PF) 2 MG/ML IV SOLN: 1.0000 mg | INTRAVENOUS | Status: DC | PRN

## 2021-02-10 MED ORDER — PROPOFOL 10 MG/ML IV BOLUS
INTRAVENOUS | Status: DC | PRN
  Administered 2021-02-10: 140 mg via INTRAVENOUS

## 2021-02-10 MED ORDER — SODIUM CHLORIDE 0.9 % IV SOLN
0.0500 ug/kg/min | INTRAVENOUS | Status: AC
  Administered 2021-02-10: .05 ug/kg/min via INTRAVENOUS
  Filled 2021-02-10: qty 5000

## 2021-02-10 MED ORDER — HYDROCODONE-ACETAMINOPHEN 5-325 MG PO TABS
1.0000 | ORAL_TABLET | ORAL | Status: DC | PRN
  Administered 2021-02-10 – 2021-02-11 (×3): 1 via ORAL
  Filled 2021-02-10 (×3): qty 1

## 2021-02-10 MED ORDER — VITAMIN B-12 1000 MCG PO TABS
1000.0000 ug | ORAL_TABLET | Freq: Every day | ORAL | Status: DC
  Administered 2021-02-10 – 2021-02-11 (×2): 1000 ug via ORAL
  Filled 2021-02-10 (×2): qty 1

## 2021-02-10 MED ORDER — HYDROCORTISONE NA SUCCINATE PF 100 MG IJ SOLR
INTRAMUSCULAR | Status: DC | PRN
  Administered 2021-02-10: 100 mg via INTRAVENOUS

## 2021-02-10 MED ORDER — LORATADINE 10 MG PO TABS
10.0000 mg | ORAL_TABLET | Freq: Every day | ORAL | Status: DC
  Administered 2021-02-10 – 2021-02-11 (×2): 10 mg via ORAL
  Filled 2021-02-10 (×2): qty 1

## 2021-02-10 MED ORDER — PROPOFOL 500 MG/50ML IV EMUL
INTRAVENOUS | Status: DC | PRN
  Administered 2021-02-10: 20 ug/kg/min via INTRAVENOUS

## 2021-02-10 MED ORDER — THROMBIN 5000 UNITS EX SOLR
OROMUCOSAL | Status: DC | PRN
  Administered 2021-02-10: 5 mL via TOPICAL

## 2021-02-10 MED ORDER — SENNA 8.6 MG PO TABS
1.0000 | ORAL_TABLET | Freq: Two times a day (BID) | ORAL | Status: DC
  Administered 2021-02-10 – 2021-02-11 (×3): 8.6 mg via ORAL
  Filled 2021-02-10 (×2): qty 1

## 2021-02-10 MED ORDER — CEPHALEXIN 500 MG PO CAPS
500.0000 mg | ORAL_CAPSULE | Freq: Three times a day (TID) | ORAL | Status: DC
  Filled 2021-02-10 (×2): qty 1

## 2021-02-10 MED ORDER — MUPIROCIN 2 % EX OINT
TOPICAL_OINTMENT | CUTANEOUS | Status: AC
  Filled 2021-02-10: qty 22

## 2021-02-10 MED ORDER — BISACODYL 5 MG PO TBEC: 5.0000 mg | DELAYED_RELEASE_TABLET | Freq: Every day | ORAL | Status: DC | PRN

## 2021-02-10 MED ORDER — SUGAMMADEX SODIUM 200 MG/2ML IV SOLN
INTRAVENOUS | Status: DC | PRN
  Administered 2021-02-10: 160 mg via INTRAVENOUS
  Administered 2021-02-10: 40 mg via INTRAVENOUS

## 2021-02-10 MED ORDER — ZINC SULFATE 220 (50 ZN) MG PO CAPS
220.0000 mg | ORAL_CAPSULE | Freq: Every day | ORAL | Status: DC
  Administered 2021-02-10 – 2021-02-11 (×2): 220 mg via ORAL
  Filled 2021-02-10 (×2): qty 1

## 2021-02-10 MED ORDER — ACETAMINOPHEN 500 MG PO TABS
1000.0000 mg | ORAL_TABLET | Freq: Once | ORAL | Status: AC
  Administered 2021-02-10: 1000 mg via ORAL
  Filled 2021-02-10: qty 2

## 2021-02-10 MED ORDER — PHENYLEPHRINE 40 MCG/ML (10ML) SYRINGE FOR IV PUSH (FOR BLOOD PRESSURE SUPPORT)
PREFILLED_SYRINGE | INTRAVENOUS | Status: AC
  Filled 2021-02-10: qty 10

## 2021-02-10 MED ORDER — MIDODRINE HCL 5 MG PO TABS
10.0000 mg | ORAL_TABLET | Freq: Three times a day (TID) | ORAL | Status: DC | PRN
Start: 2021-02-10 — End: 2021-02-11

## 2021-02-10 MED ORDER — MIDAZOLAM HCL 5 MG/5ML IJ SOLN
INTRAMUSCULAR | Status: DC | PRN
  Administered 2021-02-10: 2 mg via INTRAVENOUS

## 2021-02-10 MED ORDER — GABAPENTIN 300 MG PO CAPS
300.0000 mg | ORAL_CAPSULE | Freq: Three times a day (TID) | ORAL | Status: DC
  Administered 2021-02-10 – 2021-02-11 (×3): 300 mg via ORAL
  Filled 2021-02-10 (×3): qty 1

## 2021-02-10 MED ORDER — SENNOSIDES-DOCUSATE SODIUM 8.6-50 MG PO TABS
1.0000 | ORAL_TABLET | Freq: Every evening | ORAL | Status: DC | PRN
  Administered 2021-02-10: 1 via ORAL
  Filled 2021-02-10: qty 1

## 2021-02-10 MED ORDER — MUPIROCIN CALCIUM 2 % EX CREA
TOPICAL_CREAM | CUTANEOUS | Status: AC
  Filled 2021-02-10: qty 15

## 2021-02-10 MED ORDER — HEPARIN SODIUM (PORCINE) 5000 UNIT/ML IJ SOLN
5000.0000 [IU] | Freq: Three times a day (TID) | INTRAMUSCULAR | Status: DC
  Administered 2021-02-11: 5000 [IU] via SUBCUTANEOUS
  Filled 2021-02-10: qty 1

## 2021-02-10 SURGICAL SUPPLY — 91 items
BAND RUBBER #18 3X1/16 STRL (MISCELLANEOUS) IMPLANT
BENZOIN TINCTURE PRP APPL 2/3 (GAUZE/BANDAGES/DRESSINGS) IMPLANT
BLADE EYE SICKLE 84 5 BEAV (BLADE) IMPLANT
BLADE RAD40 ROTATE 4M 4 5PK (BLADE) IMPLANT
BLADE ROTATE TRICUT 4X13 M4 (BLADE) ×2 IMPLANT
BLADE SURG 15 STRL LF DISP TIS (BLADE) IMPLANT
BLADE SURG 15 STRL SS (BLADE)
BUR DIAMOND 13X5 70D (BURR) IMPLANT
BUR DIAMOND CURV 15X5 15D (BURR) IMPLANT
BUR MATCHSTICK NEURO 3.0 LAGG (BURR) IMPLANT
BUR TAPER CHOANAL ATRESIA 30K (BURR) ×2 IMPLANT
CANISTER SUCT 3000ML PPV (MISCELLANEOUS) ×6 IMPLANT
CARTRIDGE OIL MAESTRO DRILL (MISCELLANEOUS) ×1 IMPLANT
CATH ROBINSON RED A/P 14FR (CATHETERS) IMPLANT
COAGULATOR SUCT 8FR VV (MISCELLANEOUS) ×2 IMPLANT
COTTONBALL LRG STERILE PKG (GAUZE/BANDAGES/DRESSINGS) IMPLANT
COVER WAND RF STERILE (DRAPES) ×4 IMPLANT
DECANTER SPIKE VIAL GLASS SM (MISCELLANEOUS) ×2 IMPLANT
DIFFUSER DRILL AIR PNEUMATIC (MISCELLANEOUS) ×2 IMPLANT
DRAIN SUBARACHNOID (WOUND CARE) IMPLANT
DRAPE C-ARM 42X72 X-RAY (DRAPES) IMPLANT
DRAPE HALF SHEET 40X57 (DRAPES) IMPLANT
DRAPE MICROSCOPE LEICA (MISCELLANEOUS) IMPLANT
DRESSING NASAL POPE 10X1.5X2.5 (GAUZE/BANDAGES/DRESSINGS) IMPLANT
DRSG NASAL POPE 10X1.5X2.5 (GAUZE/BANDAGES/DRESSINGS)
DRSG NASOPORE 8CM (GAUZE/BANDAGES/DRESSINGS) ×2 IMPLANT
DURAPREP 26ML APPLICATOR (WOUND CARE) ×2 IMPLANT
ELECT COATED BLADE 2.86 ST (ELECTRODE) IMPLANT
ELECT NEEDLE TIP 2.8 STRL (NEEDLE) IMPLANT
ELECT REM PT RETURN 9FT ADLT (ELECTROSURGICAL) ×4
ELECTRODE REM PT RTRN 9FT ADLT (ELECTROSURGICAL) ×2 IMPLANT
GAUZE PACKING FOLDED 2  STR (GAUZE/BANDAGES/DRESSINGS)
GAUZE PACKING FOLDED 2 STR (GAUZE/BANDAGES/DRESSINGS) IMPLANT
GAUZE SPONGE 4X4 12PLY STRL (GAUZE/BANDAGES/DRESSINGS) ×2 IMPLANT
GLOVE BIOGEL M 7.0 STRL (GLOVE) ×4 IMPLANT
GLOVE ECLIPSE 6.5 STRL STRAW (GLOVE) ×2 IMPLANT
GLOVE EXAM NITRILE XL STR (GLOVE) IMPLANT
GOWN STRL REUS W/ TWL LRG LVL3 (GOWN DISPOSABLE) ×4 IMPLANT
GOWN STRL REUS W/ TWL XL LVL3 (GOWN DISPOSABLE) IMPLANT
GOWN STRL REUS W/TWL 2XL LVL3 (GOWN DISPOSABLE) IMPLANT
GOWN STRL REUS W/TWL LRG LVL3 (GOWN DISPOSABLE) ×4
GOWN STRL REUS W/TWL XL LVL3 (GOWN DISPOSABLE)
HEMOSTAT SURGICEL 2X14 (HEMOSTASIS) IMPLANT
KIT BASIN OR (CUSTOM PROCEDURE TRAY) ×4 IMPLANT
KIT TURNOVER KIT B (KITS) ×4 IMPLANT
NEEDLE HYPO 25GX1X1/2 BEV (NEEDLE) ×2 IMPLANT
NEEDLE HYPO 25X1 1.5 SAFETY (NEEDLE) ×2 IMPLANT
NEEDLE SPNL 22GX3.5 QUINCKE BK (NEEDLE) ×2 IMPLANT
NEEDLE SPNL 25GX3.5 QUINCKE BL (NEEDLE) ×2 IMPLANT
NS IRRIG 1000ML POUR BTL (IV SOLUTION) ×4 IMPLANT
OIL CARTRIDGE MAESTRO DRILL (MISCELLANEOUS) ×2
PACK LAMINECTOMY NEURO (CUSTOM PROCEDURE TRAY) ×2 IMPLANT
PAD ARMBOARD 7.5X6 YLW CONV (MISCELLANEOUS) ×6 IMPLANT
PATTIES SURGICAL .25X.25 (GAUZE/BANDAGES/DRESSINGS) IMPLANT
PATTIES SURGICAL .5 X3 (DISPOSABLE) ×2 IMPLANT
SHEATH ENDOSCRUB 0 DEG (SHEATH) ×2 IMPLANT
SHEATH ENDOSCRUB 30 DEG (SHEATH) IMPLANT
SHEATH ENDOSCRUB 45 DEG (SHEATH) IMPLANT
SPECIMEN JAR SMALL (MISCELLANEOUS) ×2 IMPLANT
SPLINT NASAL DOYLE BI-VL (GAUZE/BANDAGES/DRESSINGS) IMPLANT
SPONGE LAP 4X18 RFD (DISPOSABLE) ×2 IMPLANT
SPONGE NEURO XRAY DETECT 1X3 (DISPOSABLE) ×2 IMPLANT
SPONGE SURGIFOAM ABS GEL SZ50 (HEMOSTASIS) IMPLANT
STAPLER SKIN PROX WIDE 3.9 (STAPLE) IMPLANT
STRIP CLOSURE SKIN 1/2X4 (GAUZE/BANDAGES/DRESSINGS) IMPLANT
SUT 5.0 PDS RB-1 (SUTURE)
SUT ETHILON 3 0 FSL (SUTURE) IMPLANT
SUT ETHILON 3 0 PS 1 (SUTURE) IMPLANT
SUT ETHILON 6 0 P 1 (SUTURE) IMPLANT
SUT PDS AB 4-0 RB1 27 (SUTURE) IMPLANT
SUT PDS PLUS AB 5-0 RB-1 (SUTURE) IMPLANT
SUT PLAIN 4 0 ~~LOC~~ 1 (SUTURE) IMPLANT
SUT VIC AB 2-0 CT1 27 (SUTURE)
SUT VIC AB 2-0 CT1 27XBRD (SUTURE) IMPLANT
SUT VIC AB 2-0 CT2 18 VCP726D (SUTURE) IMPLANT
SUT VIC AB 3-0 SH 8-18 (SUTURE) IMPLANT
SUT VIC AB 4-0 P-3 18X BRD (SUTURE) IMPLANT
SUT VIC AB 4-0 P3 18 (SUTURE)
SYR 5ML LL (SYRINGE) IMPLANT
TOWEL GREEN STERILE (TOWEL DISPOSABLE) ×2 IMPLANT
TOWEL GREEN STERILE FF (TOWEL DISPOSABLE) ×4 IMPLANT
TRACKER ENT INSTRUMENT (MISCELLANEOUS) ×2 IMPLANT
TRACKER ENT PATIENT (MISCELLANEOUS) ×2 IMPLANT
TRAP SPECIMEN MUCUS 40CC (MISCELLANEOUS) ×2 IMPLANT
TRAY ENT MC OR (CUSTOM PROCEDURE TRAY) ×4 IMPLANT
TRAY FOLEY MTR SLVR 16FR STAT (SET/KITS/TRAYS/PACK) ×2 IMPLANT
TUBE CONNECTING 12X1/4 (SUCTIONS) ×2 IMPLANT
TUBING EXTENTION W/L.L. (IV SETS) ×2 IMPLANT
TUBING STRAIGHTSHOT EPS 5PK (TUBING) ×2 IMPLANT
UNDERPAD 30X36 HEAVY ABSORB (UNDERPADS AND DIAPERS) ×2 IMPLANT
WATER STERILE IRR 1000ML POUR (IV SOLUTION) ×4 IMPLANT

## 2021-02-10 NOTE — Op Note (Signed)
02/10/2021  10:43 AM  PATIENT:  Stefanie Wade  50 y.o. female  PRE-OPERATIVE DIAGNOSIS:  Pituitary Tumor  POST-OPERATIVE DIAGNOSIS:  pituitary tumor  PROCEDURE:  Procedure(s): Transspheniodal resection of pituitary tumor TRANSPHENOIDAL APPROACH EXPOSURE  SURGEON: Surgeon(s): Ashok Pall, MD Jerrell Belfast, MD  ASSISTANTS:none  ANESTHESIA:   general  EBL:  Total I/O In: 48 [I.V.:1800; IV Piggyback:100] Out: 28 [Urine:800; Blood:50]  BLOOD ADMINISTERED:none  CELL SAVER GIVEN:none  COUNT:per nursing  DRAINS: none   SPECIMEN:  Source of Specimen:  pituitary gland  DICTATION: [name] was taken to the operating room, intubated, and placed under a general anesthetic without difficulty. She was positioned on the bed supine. At this point Dr. Wilburn Cornelia proceeded with his exposure of the sella. This is dictated under separate cover.  Once exposure was obtained, I joined the case. I removed some bone overlying the dura of the sella. I cauterized said dura. I opened the dura with an 11 blade making a slit. I used dissectors to enlarge the dural opening, and allowed a good amount of fluid to issue from the opening. I removed some more tissue and sent it to pathology. I used the variety of instruments to inspect the cavity. No more fluid, nor tissue was obtained. It certainly appeared on preoperative testing that the mass was fairly discrete, and I was satisfied with the resection. At this point, Dr. Wilburn Cornelia proceeded with his closure.   PLAN OF CARE: Admit to inpatient   PATIENT DISPOSITION:  PACU - hemodynamically stable.   Delay start of Pharmacological VTE agent (>24hrs) due to surgical blood loss or risk of bleeding:  no

## 2021-02-10 NOTE — H&P (Signed)
BP 119/66   Pulse 76   Temp (!) 97.1 F (36.2 C) (Oral)   Resp 18   Ht 5\' 6"  (1.676 m)   Wt 78 kg   SpO2 99%   BMI 27.76 kg/m  Stefanie Wade presents today for evaluation of what she was told she had a mass in her brain.  She had undergone an MRI on 12/20/2020.  What it showed was that she had a pituitary mass.  She had headaches which were unrelenting and vertigo, and she was told that she would come to see a neurosurgeon.  Stefanie Wade otherwise was doing well.  She had no problems on physical exam.  The mass was of mixed density entity, which does abut the optic nerves.  It was measured at a 1.3 x 1.4 x 1.3 cm sellar mass, which extended into the suprasellar space and a mass effect on the optic chiasm. Allergies  Allergen Reactions  . Morphine And Related Nausea And Vomiting    Jittery   . Other     Tylenol #3 becomes jittery when taking   Past Medical History:  Diagnosis Date  . Anemia   . Arthritis   . Bilateral carpal tunnel syndrome   . Chronic fatigue   . Depression   . Fibromyalgia   . GERD (gastroesophageal reflux disease)   . Headache    Migraines  . History of kidney stones   . Low blood pressure   . Lower extremity numbness   . Numbness and tingling of both upper extremities while sleeping   . Panic attack   . PONV (postoperative nausea and vomiting)   . PTSD (post-traumatic stress disorder)   . Shortness of breath dyspnea    with stairs  . Vertigo    Family History  Problem Relation Age of Onset  . Diabetes Mother   . Hypertension Mother   . Diabetes Sister   . Diabetes Maternal Grandmother   . Hypertension Maternal Grandmother   . Diabetes Paternal Grandmother   . Hypertension Paternal Grandmother   . Heart attack Neg Hx   . Hyperlipidemia Neg Hx   . Sudden death Neg Hx    Prior to Admission medications   Medication Sig Start Date End Date Taking? Authorizing Provider  ALPRAZolam Duanne Moron) 1 MG tablet Take 1 mg by mouth every 6 (six) hours as needed  for anxiety.  05/16/17  Yes [provider]  Cholecalciferol (VITAMIN D) 50 MCG (2000 UT) tablet Take 2,000 Units by mouth daily.   Yes [provider]  FLUoxetine (PROZAC) 20 MG capsule Take 20 mg by mouth 2 (two) times daily.   Yes [provider]  gabapentin (NEURONTIN) 300 MG capsule Take 300 mg by mouth 3 (three) times daily.   Yes [provider]  Multiple Vitamins-Minerals (MULTIVITAMIN WITH MINERALS) tablet Take 1 tablet by mouth daily.   Yes [provider]  Oxycodone HCl 10 MG TABS Take 10 mg by mouth 3 (three) times daily. 05/16/17  Yes [provider]  vitamin B-12 (CYANOCOBALAMIN) 1000 MCG tablet Take 1,000 mcg by mouth daily.   Yes [provider]  zinc gluconate 50 MG tablet Take 50 mg by mouth daily.   Yes [provider]  cetirizine (ZYRTEC) 10 MG tablet Take 10 mg by mouth daily as needed. For allergies 03/20/16   [provider]  cyclobenzaprine (FLEXERIL) 10 MG tablet Take 1 tablet (10 mg total) by mouth 2 (two) times daily as needed for muscle spasms.  11/10/18   Loura Halt A, NP  midodrine (PROAMATINE) 10 MG tablet Take 10 mg by mouth 3 (three) times daily as needed (for blood pressure).     [provider]       PHYSICAL EXAMINATION :  Stefanie Wade was alert and oriented by 4, answering all questions appropriately.  Memory, language, attention span, and fund of knowledge are normal.  Speech is clear, it is also fluent.  Hearing intact to voice bilaterally.  Uvula elevates in midline.  Shoulder shrug is normal.  Full extraocular movements.  Full visual fields on confrontation.  She had no drift. 5/5 strength.  Gait was normal.  Romberg negative.  Muscle tone, bulk, and coordination are normal.  Reflexes 2+ in the upper and lower extremities.  Biceps, triceps, brachioradialis, knees, and ankles were intact.  Proprioception is intact.     ASSESSMENT AND PLAN :  Stefanie Wade initially  presented without a pituitary panel.  I then sent her for some labs.  Her luteinizing hormone was 10.3, which was normal.  FSH was 5.6, which was normal.  TSH was 1.2, which was normal.  ACTH was also normal at 39.5.  Prolactin, however, was 90.6. Insulin growth factor was 145. She will go for a transsphenoidal tumor resection of the mass. The prolactin at 90.6 is not most likely an active prolactinoma, thus resection I believe is the best choice

## 2021-02-10 NOTE — Discharge Instructions (Signed)
Sinus/Nasal Instructions:  1. Limited activity 2. Liquid and soft diet 3. May bathe and shower 4. Saline nasal spray - 4 puffs/nostril every hour while awake, begin the morning after surgery 5. Elevate Head of Bed 6. No nose blowing/Open mouth sneeze 7. Alternate Tylenol and ibuprofen every 6 hours as needed for pain.  Call [facility] for any questions or concerns regarding sinus issues: [phone]

## 2021-02-10 NOTE — Anesthesia Procedure Notes (Addendum)
Procedure Name: Intubation Date/Time: 02/10/2021 7:55 AM Performed by: Kyung Rudd, CRNA Pre-anesthesia Checklist: Patient identified, Emergency Drugs available, Suction available, Patient being monitored and Timeout performed Patient Re-evaluated:Patient Re-evaluated prior to induction Oxygen Delivery Method: Circle system utilized Preoxygenation: Pre-oxygenation with 100% oxygen Induction Type: IV induction Ventilation: Mask ventilation without difficulty Laryngoscope Size: Mac and 3 Grade View: Grade I Tube type: Oral Tube size: 7.0 mm Number of attempts: 1 Airway Equipment and Method: Stylet Placement Confirmation: ETT inserted through vocal cords under direct vision,  breath sounds checked- equal and bilateral and positive ETCO2 Secured at: 22 cm Tube secured with: Tape Dental Injury: Teeth and Oropharynx as per pre-operative assessment  Comments: AOI by Domingo Madeira, SRNA with Dr. Gifford Shave supervising.

## 2021-02-10 NOTE — Anesthesia Procedure Notes (Signed)
Arterial Line Insertion Start/End2/25/2022 7:55 AM, 02/10/2021 8:00 AM Performed by: Catalina Gravel, MD, anesthesiologist  Patient location: OR. Preanesthetic checklist: patient identified, IV checked, site marked, risks and benefits discussed, surgical consent, monitors and equipment checked, pre-op evaluation, timeout performed and anesthesia consent Lidocaine 1% used for infiltration Right, radial was placed Catheter size: 20 Fr Hand hygiene performed  and maximum sterile barriers used   Attempts: 1 Procedure performed without using ultrasound guided technique. Following insertion, dressing applied and Biopatch. Post procedure assessment: normal and unchanged  Additional procedure comments: Attempt x1 on left with inability to thread wire.  Attempt x1 on right with successful placement.Stefanie Wade

## 2021-02-10 NOTE — Transfer of Care (Signed)
Immediate Anesthesia Transfer of Care Note  Patient: Stefanie Wade  Procedure(s) Performed: Transspheniodal resection of pituitary tumor (N/A ) TRANSPHENOIDAL APPROACH EXPOSURE (N/A )  Patient Location: PACU  Anesthesia Type:General  Level of Consciousness: drowsy  Airway & Oxygen Therapy: Patient Spontanous Breathing and Patient connected to face mask oxygen  Post-op Assessment: Report given to RN, Post -op Vital signs reviewed and stable and Patient moving all extremities X 4  Post vital signs: Reviewed and stable  Last Vitals:  Vitals Value Taken Time  BP 124/79 02/10/21 1028  Temp    Pulse 80 02/10/21 1030  Resp 14 02/10/21 1030  SpO2 100 % 02/10/21 1030  Vitals shown include unvalidated device data.  Last Pain:  Vitals:   02/10/21 0637  TempSrc:   PainSc: 7       Patients Stated Pain Goal: 7 (18/84/16 6063)  Complications: No complications documented.

## 2021-02-10 NOTE — Anesthesia Postprocedure Evaluation (Signed)
Anesthesia Post Note  Patient: Academic librarian  Procedure(s) Performed: Transspheniodal resection of pituitary tumor (N/A ) TRANSPHENOIDAL APPROACH EXPOSURE (N/A )     Patient location during evaluation: PACU Anesthesia Type: General Level of consciousness: awake and alert Pain management: pain level controlled Vital Signs Assessment: post-procedure vital signs reviewed and stable Respiratory status: spontaneous breathing, nonlabored ventilation, respiratory function stable and patient connected to nasal cannula oxygen Cardiovascular status: blood pressure returned to baseline and stable Postop Assessment: no apparent nausea or vomiting Anesthetic complications: no   No complications documented.  Last Vitals:  Vitals:   02/10/21 1300 02/10/21 1400  BP: 125/80 109/73  Pulse: 75 77  Resp: 15 15  Temp:    SpO2: 99% 98%    Last Pain:  Vitals:   02/10/21 1300  TempSrc:   PainSc: 3                  Tiajuana Amass

## 2021-02-10 NOTE — Op Note (Signed)
Operative Note:  ENDOSCOPIC TRANSSPHENOIDAL PITUITARY RESECTION WITH NAVIGATION      Patient: Stefanie Wade record number: [id number]  Date:02/10/2021  Pre-operative Indications: 1.  Pituitary Mass       Postoperative Indications: Same  Surgical Procedure: 1. Endoscopic transsphenoidal pituitary resection with intraoperative navigation    2.  Bilateral sphenoidotomy      Anesthesia: GET  Surgeon: Delsa Bern, M.D.  Neurosurgeon: Dr. Christella Noa  Complications: None  EBL: 50 cc  Findings: Cystic appearing pituitary mass.  No significant bleeding or CSF leak. Naso-pore sphenoid packing placed.  Note: The neurosurgical component of the operative procedure is dictated as a separate operative note.   Brief History: The patient is a 50 y.o. female with a history of pituitary mass. The patient has a history of headache. The patient was referred to Dr. Christella Noa for neurosurgical evaluation.  Patient seen by me at [facility] preoperatively with review of nasal anatomy and sinus CT scan for navigation.  Given the patient's history and findings, the above surgical procedures were recommended, risks and benefits were discussed in detail with the patient.  They understand and agree with our plan for surgery which is scheduled at [facility] under general anesthesia.  Surgical Procedure: The patient is brought to the neurosurgical operating room on 02/10/2021 and placed in supine position on the operating table. General endotracheal anesthesia was established without difficulty. When the patient was adequately anesthetized, surgical timeout was performed with correct identification of the patient and the surgical procedure. The patient's nose was then injected with 7 cc of 1% lidocaine 1:100,000 dilution epinephrine which was injected in a submucosal fashion. The patient's nose was then packed with Afrin-soaked cottonoid pledgets were left in place for approximately 10  minutes to allow for vasoconstriction and hemostasis.  The Xomed Fusion navigation headgear was applied and anatomic and surgical landmarks were identified and confirmed, navigation was used throughout the sinus component of the surgical procedure.  With the patient prepped draped and prepared for surgery, nasal endoscopy was performed on the patient's right.  The middle turbinate was carefully lateralized to allow access to the posterior aspect of the nasal passageway.  The right sphenoid sinus ostium was identified.  The inferior aspect of the superior turbinate was then resected with through-cutting forceps and a microdebrider.  The right sphenoid sinus ostium was enlarged in a superior and lateral direction using the microdebrider and through-cutting forceps to create a widely patent ostium.  Nasal endoscopy on the patient's left-hand side was then undertaken.  The left middle turbinate was lateralized and the posterior nasal cavity was visualized with identification of the left sphenoid sinus ostium using navigation.  The inferior aspect of the superior turbinate was resected and the sinus ostium was enlarged in the lateral and superior direction to create a wide sphenoid sinus ostium.  A posterior septectomy was then performed with a Surveyor, quantity.  Bone, cartilage and soft tissue was then resected to create a wide posterior septotomy.  The anterior face of the sphenoid sinus and sphenoid sinus septum were then resected with a combination of through-cutting forceps and a high-speed endoscopic drill to allow direct access to the entire posterior aspect of the sphenoid sinus and pituitary fossa.  Sphenoid sinus mucosa overlying the pituitary fossa was elevated and lateralized.  The anterior face of the pituitary fossa was demarcated using navigation.  With adequate access to the pituitary fossa the neurosurgical component of the procedure was begun by Dr. Christella Noa.  This is dictated as a separate operative  report.  Resection of the pituitary tumor was undertaken using direct visualization of the 0 degree endoscope, navigation and blunt and sharp dissection.  With pituitary tumor resection completed, reconstruction was undertaken. Surgicel was placed over the pituitary fossa defect and the sphenoid sinus was loosely packed with Naso-pore absorbable nasal packing.  There was no active bleeding and no evidence of spinal fluid leak.  The sphenoid sinus was carefully inspected, no further bleeding along the mucosal margins, sphenoidotomy sites or posterior septectomy.  The patient's nasal cavity was irrigated and suctioned.  Surgical sponge count was correct. An oral gastric tube was passed and the stomach contents were aspirated. Patient was awakened from anesthetic and transferred from the operating room to the recovery room in stable condition. There were no complications and blood loss was 50 cc.   Delsa Bern, M.D. [facility] 02/10/2021

## 2021-02-10 NOTE — Progress Notes (Signed)
   ENT Progress Note: Procedure(s): Transspheniodal resection of pituitary tumor TRANSPHENOIDAL APPROACH EXPOSURE   Subjective: Stable preop  Objective: Vital signs in last 24 hours: Temp:  [97.1 F (36.2 C)] 97.1 F (36.2 C) (02/25 0558) Pulse Rate:  [76] 76 (02/25 0558) Resp:  [18] 18 (02/25 0558) BP: (119)/(66) 119/66 (02/25 0558) SpO2:  [99 %] 99 % (02/25 0558) Weight:  [78 kg] 78 kg (02/25 0558) Weight change:     Intake/Output from previous day: No intake/output data recorded. Intake/Output this shift: No intake/output data recorded.  Labs: Recent Labs    02/08/21 1230  WBC 3.8*  HGB 10.7*  HCT 33.0*  PLT 300   Recent Labs    02/08/21 1230  NA 137  K 3.9  CL 104  CO2 24  GLUCOSE 88  BUN 7  CALCIUM 8.8*    Studies/Results: No results found.   PHYSICAL EXAM: Patent nasal passage   Assessment/Plan: Adm for Endoscopic trans-sphenoidal pituitary resection.    Jerrell Belfast 02/10/2021, 7:25 AM

## 2021-02-11 ENCOUNTER — Encounter (HOSPITAL_COMMUNITY): Payer: Self-pay | Admitting: Neurosurgery

## 2021-02-11 MED ORDER — CEPHALEXIN 500 MG PO CAPS: 500.0000 mg | ORAL_CAPSULE | Freq: Three times a day (TID) | ORAL | 0 refills | Status: DC

## 2021-02-11 MED ORDER — HYDROCODONE-ACETAMINOPHEN 5-325 MG PO TABS: 1.0000 | ORAL_TABLET | ORAL | 0 refills | Status: AC | PRN

## 2021-02-11 MED ORDER — HYDROCORTISONE 10 MG PO TABS: ORAL_TABLET | ORAL | 0 refills | Status: AC

## 2021-02-11 NOTE — Progress Notes (Signed)
Discharged patient with all belongings. All questions answered to patient satisfaction.

## 2021-02-11 NOTE — Discharge Summary (Signed)
Physician Discharge Summary  Patient ID: Stefanie Wade MRN: [id number] DOB/AGE: 05-06-1971 50 y.o.  Admit date: 02/10/2021 Discharge date: 02/11/2021  Admission Diagnoses: Pituitary adenoma  Discharge Diagnoses: Pituitary adenoma Active Problems:   Status post transsphenoidal pituitary resection [facility])   Pituitary tumor   Discharged Condition: good  Hospital Course: Patient was admitted to undergo transsphenoidal pituitary surgery which she tolerated well.  She has not had any diabetes insipidus.  She is discharged home on oral antibiotic and oral Cortef for 3 days  Consults: Otolaryngology  Significant Diagnostic Studies: None  Treatments: surgery: Transsphenoidal resection of pituitary adenoma using endoscopic technique  Discharge Exam: Blood pressure 112/69, pulse 78, temperature 98.4 F (36.9 C), temperature source Oral, resp. rate 15, height 5\' 6"  (1.676 m), weight 78 kg, SpO2 90 %. Nasal dressing is dry motor function is normal eyesight is normal  Disposition: Discharge disposition: 01-Home or Self Care       Discharge Instructions    Call MD for:  redness, tenderness, or signs of infection (pain, swelling, redness, odor or green/yellow discharge around incision site)   Complete by: As directed    Call MD for:  severe uncontrolled pain   Complete by: As directed    Call MD for:  temperature >100.4   Complete by: As directed    Diet - low sodium heart healthy   Complete by: As directed    Diet general   Complete by: As directed    Discharge wound care:   Complete by: As directed    Keep gauze bandage in front of nose for the next 3 days until no drainage is observed   Increase activity slowly   Complete by: As directed      Allergies as of 02/11/2021      Reactions   Codeine    Becomes jittery when she takes Tylenol #3 (contains codeine)   Morphine And Related Nausea And Vomiting   Jittery       Medication List    TAKE these medications    ALPRAZolam 1 MG tablet Commonly known as: XANAX Take 1 mg by mouth every 6 (six) hours as needed for anxiety.   cephALEXin 500 MG capsule Commonly known as: Keflex Take 1 capsule (500 mg total) by mouth 3 (three) times daily for 7 days.   cephALEXin 500 MG capsule Commonly known as: KEFLEX Take 1 capsule (500 mg total) by mouth every 8 (eight) hours.   cetirizine 10 MG tablet Commonly known as: ZYRTEC Take 10 mg by mouth daily as needed. For allergies   cyclobenzaprine 10 MG tablet Commonly known as: FLEXERIL Take 1 tablet (10 mg total) by mouth 2 (two) times daily as needed for muscle spasms.   FLUoxetine 20 MG capsule Commonly known as: PROZAC Take 20 mg by mouth 2 (two) times daily.   gabapentin 300 MG capsule Commonly known as: NEURONTIN Take 300 mg by mouth 3 (three) times daily.   HYDROcodone-acetaminophen 5-325 MG tablet Commonly known as: NORCO/VICODIN Take 1 tablet by mouth every 4 (four) hours as needed for moderate pain.   hydrocortisone 10 MG tablet Commonly known as: CORTEF 1 tablet twice daily for 3 days then stop   midodrine 10 MG tablet Commonly known as: PROAMATINE Take 10 mg by mouth 3 (three) times daily as needed (for blood pressure).   multivitamin with minerals tablet Take 1 tablet by mouth daily.   Oxycodone HCl 10 MG Tabs Take 10 mg by mouth 3 (three) times daily.  vitamin B-12 1000 MCG tablet Commonly known as: CYANOCOBALAMIN Take 1,000 mcg by mouth daily.   Vitamin D 50 MCG (2000 UT) tablet Take 2,000 Units by mouth daily.   zinc gluconate 50 MG tablet Take 50 mg by mouth daily.            Discharge Care Instructions  (From admission, onward)         Start     Ordered   02/11/21 0000  Discharge wound care:       Comments: Keep gauze bandage in front of nose for the next 3 days until no drainage is observed   02/11/21 1155          Follow-up Information    Jerrell Belfast, MD In 2 weeks.   Specialty:  Otolaryngology Contact information: [address] [place] [address] [phone]               Signed: Earleen Newport 02/11/2021, 11:55 AM

## 2021-02-11 NOTE — Progress Notes (Signed)
Patient ID: Stefanie Wade, female   DOB: 1971/05/08, 50 y.o.   MRN: [id number] Vital signs are stable Patient's sats are dipping into the 80s She is not expressing any difficulty with breathing She is concerned about the potential for some blood work postoperatively She has not had any diabetes insipidus She is on oral Cortef We will transfer her to the hospital floor for further ambulation observation Use incentive spirometer If patient remains stable may be good for discharge tomorrow

## 2021-02-13 LAB — SURGICAL PATHOLOGY

## 2021-02-17 ENCOUNTER — Other Ambulatory Visit: Payer: Self-pay

## 2021-02-17 ENCOUNTER — Emergency Department (HOSPITAL_COMMUNITY)
Admission: EM | Admit: 2021-02-17 | Discharge: 2021-02-18 | Disposition: A | Discharge status: Home / Self Care | Payer: Medicare HMO | Attending: Emergency Medicine | Admitting: Emergency Medicine

## 2021-02-17 ENCOUNTER — Encounter (HOSPITAL_COMMUNITY): Payer: Self-pay

## 2021-02-17 ENCOUNTER — Emergency Department (HOSPITAL_COMMUNITY): Payer: Medicare HMO

## 2021-02-17 DIAGNOSIS — R202 Paresthesia of skin: Secondary | ICD-10-CM | POA: Insufficient documentation

## 2021-02-17 DIAGNOSIS — R079 Chest pain, unspecified: Secondary | ICD-10-CM

## 2021-02-17 DIAGNOSIS — R0602 Shortness of breath: Secondary | ICD-10-CM | POA: Diagnosis not present

## 2021-02-17 LAB — CBC
HCT: 33.8 % — ABNORMAL LOW (ref 36.0–46.0)
Hemoglobin: 10.6 g/dL — ABNORMAL LOW (ref 12.0–15.0)
MCH: 29.9 pg (ref 26.0–34.0)
MCHC: 31.4 g/dL (ref 30.0–36.0)
MCV: 95.2 fL (ref 80.0–100.0)
Platelets: 336 10*3/uL (ref 150–400)
RBC: 3.55 MIL/uL — ABNORMAL LOW (ref 3.87–5.11)
RDW: 13.2 % (ref 11.5–15.5)
WBC: 6.5 10*3/uL (ref 4.0–10.5)
nRBC: 0 % (ref 0.0–0.2)

## 2021-02-17 LAB — I-STAT BETA HCG BLOOD, ED (MC, WL, AP ONLY): I-stat hCG, quantitative: <5 m[IU]/mL (ref <5)

## 2021-02-17 NOTE — ED Triage Notes (Signed)
EMS reports pt is from home. C/O chest pain with left arm  Numbness. Recently had cholecystectomy - 1 week ago. Gave ASA 325mg  PO. Pt describes chest pain as tightness and it is intermittent.

## 2021-02-18 ENCOUNTER — Emergency Department (HOSPITAL_COMMUNITY): Payer: Medicare HMO

## 2021-02-18 LAB — TROPONIN I (HIGH SENSITIVITY)
Troponin I (High Sensitivity): 3 ng/L (ref <18)
Troponin I (High Sensitivity): <2 ng/L (ref <18)

## 2021-02-18 LAB — BASIC METABOLIC PANEL
Anion gap: 9 (ref 5–15)
BUN: 6 mg/dL (ref 6–20)
CO2: 22 mmol/L (ref 22–32)
Calcium: 8.8 mg/dL — ABNORMAL LOW (ref 8.9–10.3)
Chloride: 105 mmol/L (ref 98–111)
Creatinine, Ser: 0.95 mg/dL (ref 0.44–1.00)
GFR, Estimated: >60 mL/min (ref >60)
Glucose, Bld: 130 mg/dL — ABNORMAL HIGH (ref 70–99)
Potassium: 3.9 mmol/L (ref 3.5–5.1)
Sodium: 136 mmol/L (ref 135–145)

## 2021-02-18 MED ORDER — LORAZEPAM 2 MG/ML IJ SOLN
1.0000 mg | Freq: Once | INTRAMUSCULAR | Status: AC
  Administered 2021-02-18: 1 mg via INTRAVENOUS
  Filled 2021-02-18: qty 1

## 2021-02-18 MED ORDER — METOCLOPRAMIDE HCL 5 MG/ML IJ SOLN
10.0000 mg | Freq: Once | INTRAMUSCULAR | Status: AC
  Administered 2021-02-18: 10 mg via INTRAVENOUS
  Filled 2021-02-18: qty 2

## 2021-02-18 MED ORDER — SODIUM CHLORIDE 0.9 % IV BOLUS
1000.0000 mL | Freq: Once | INTRAVENOUS | Status: AC
  Administered 2021-02-18: 1000 mL via INTRAVENOUS

## 2021-02-18 MED ORDER — IOHEXOL 350 MG/ML SOLN
100.0000 mL | Freq: Once | INTRAVENOUS | Status: AC | PRN
  Administered 2021-02-18: 50 mL via INTRAVENOUS

## 2021-02-18 NOTE — Discharge Instructions (Addendum)
Call your doctor's office to arrange for a close follow up appointment for your symptoms.

## 2021-02-18 NOTE — ED Notes (Signed)
CT called requesting "something to help patient relax" MD aware

## 2021-02-18 NOTE — ED Provider Notes (Signed)
[facility] EMERGENCY DEPARTMENT Provider Note   CSN: [id number] Arrival date & time: 02/17/21  2246     History Chief Complaint  Patient presents with  . Chest Pain    Stefanie Wade is a 50 y.o. female.  Patient here with left arm numbness and intermittent chest pain with shortness of breath, dyspnea on exertion.  Patient states is been going on for about 6 days now.  She had a pituitary resection done about a week ago without any significant complications however she stated a day or so afterwards her left arm started having progressively worsening paresthesias starting in the wrist and movements way up.  Patient has some bruising there from the previous IV site and states that she thinks this might be related.  She states that when she walks around too much she gets little bit short of breath.  No lower extremity swelling.  No asymmetric leg pain.  No known history of blood clots.   Chest Pain      Past Medical History:  Diagnosis Date  . Anemia   . Arthritis   . Bilateral carpal tunnel syndrome   . Chronic fatigue   . Depression   . Fibromyalgia   . GERD (gastroesophageal reflux disease)   . Headache    Migraines  . History of kidney stones   . Low blood pressure   . Lower extremity numbness   . Numbness and tingling of both upper extremities while sleeping   . Panic attack   . PONV (postoperative nausea and vomiting)   . PTSD (post-traumatic stress disorder)   . Shortness of breath dyspnea    with stairs  . Vertigo     Patient Active Problem List   Diagnosis Date Noted  . Status post transsphenoidal pituitary resection ([facility]) 02/10/2021  . Pituitary tumor 02/10/2021  . Right knee injury 09/17/2011    Past Surgical History:  Procedure Laterality Date  . CARDIOVASCULAR STRESS TEST  07/08/2017   [facility]: Myocardial perfusion imaging is normal. LVEF 67%.  . CESAREAN SECTION    . CRANIOTOMY N/A 02/10/2021   Procedure:  Transspheniodal resection of pituitary tumor;  Surgeon: Ashok Pall, MD;  Location: [facility];  Service: Neurosurgery;  Laterality: N/A;  . FOOT SURGERY    . IUD REMOVAL  09/03/2016   Procedure: INTRAUTERINE DEVICE (IUD) REMOVAL;  Surgeon: Olga Millers, MD;  Location: [facility];  Service: Gynecology;;  . LAPAROSCOPIC TUBAL LIGATION N/A 09/03/2016   Procedure: LAPAROSCOPIC TUBAL LIGATION;  Surgeon: Olga Millers, MD;  Location: [facility];  Service: Gynecology;  Laterality: N/A;  FILSHIE CLIPS  . TRANSPHENOIDAL APPROACH EXPOSURE N/A 02/10/2021   Procedure: TRANSPHENOIDAL APPROACH EXPOSURE;  Surgeon: Jerrell Belfast, MD;  Location: [facility];  Service: ENT;  Laterality: N/A;  . TRANSTHORACIC ECHOCARDIOGRAM  07/18/2017   [facility]: LV cavity normal in size, mild-mod LVH, normal wall motion and diastolic filling pattern, EF 55%, borderline MVP, no significant myxomatous degeneration, mild MR, trace TR/PR     OB History   No obstetric history on file.     Family History  Problem Relation Age of Onset  . Diabetes Mother   . Hypertension Mother   . Diabetes Sister   . Diabetes Maternal Grandmother   . Hypertension Maternal Grandmother   . Diabetes Paternal Grandmother   . Hypertension Paternal Grandmother   . Heart attack Neg Hx   . Hyperlipidemia Neg Hx   . Sudden death Neg Hx  Social History   Tobacco Use  . Smoking status: Never Smoker  . Smokeless tobacco: Never Used  Vaping Use  . Vaping Use: Never used  Substance Use Topics  . Alcohol use: Yes    Comment: occ  . Drug use: No    Home Medications Prior to Admission medications   Medication Sig Start Date End Date Taking? Authorizing Provider  ALPRAZolam Duanne Moron) 1 MG tablet Take 1 mg by mouth every 6 (six) hours as needed for anxiety.  05/16/17   [provider]  cephALEXin (KEFLEX) 500 MG capsule Take 1 capsule (500 mg total) by mouth every 8 (eight) hours. 02/11/21   Kristeen Miss, MD  cetirizine (ZYRTEC)  10 MG tablet Take 10 mg by mouth daily as needed. For allergies 03/20/16   [provider]  Cholecalciferol (VITAMIN D) 50 MCG (2000 UT) tablet Take 2,000 Units by mouth daily.    [provider]  cyclobenzaprine (FLEXERIL) 10 MG tablet Take 1 tablet (10 mg total) by mouth 2 (two) times daily as needed for muscle spasms. 11/10/18   Loura Halt A, NP  FLUoxetine (PROZAC) 20 MG capsule Take 20 mg by mouth 2 (two) times daily.    [provider]  gabapentin (NEURONTIN) 300 MG capsule Take 300 mg by mouth 3 (three) times daily.    [provider]  HYDROcodone-acetaminophen (NORCO/VICODIN) 5-325 MG tablet Take 1 tablet by mouth every 4 (four) hours as needed for moderate pain. 02/11/21   Kristeen Miss, MD  hydrocortisone (CORTEF) 10 MG tablet 1 tablet twice daily for 3 days then stop 02/11/21   Kristeen Miss, MD  midodrine (PROAMATINE) 10 MG tablet Take 10 mg by mouth 3 (three) times daily as needed (for blood pressure).     [provider]  Multiple Vitamins-Minerals (MULTIVITAMIN WITH MINERALS) tablet Take 1 tablet by mouth daily.    [provider]  Oxycodone HCl 10 MG TABS Take 10 mg by mouth 3 (three) times daily. 05/16/17   [provider]  vitamin B-12 (CYANOCOBALAMIN) 1000 MCG tablet Take 1,000 mcg by mouth daily.    [provider]  zinc gluconate 50 MG tablet Take 50 mg by mouth daily.    [provider]    Allergies    Codeine and Morphine and related  Review of Systems   Review of Systems  Cardiovascular: Positive for chest pain.  All other systems reviewed and are negative.   Physical Exam Updated Vital Signs BP 117/67   Pulse 67   Temp 99.2 F (37.3 C) (Oral)   Resp 15   Ht 5\' 6"  (1.676 m)   Wt 78 kg   LMP  (LMP Unknown)   SpO2 100%   BMI 27.76 kg/m   Physical Exam Vitals and nursing note reviewed.  Constitutional:      Appearance: She is well-developed and well-nourished.  HENT:     Head:  Normocephalic and atraumatic.  Cardiovascular:     Rate and Rhythm: Normal rate and regular rhythm.  Pulmonary:     Effort: No respiratory distress.     Breath sounds: No stridor. No decreased breath sounds or wheezing.  Abdominal:     General: There is no distension.     Palpations: Abdomen is soft.  Musculoskeletal:        General: Normal range of motion.     Cervical back: Normal range of motion.  Skin:    General: Skin is warm and dry.     Findings:  Ecchymosis (bilateral volar forearm) present.  Neurological:     Mental Status: She is alert.     Comments: Globally weak/poor effort     ED Results / Procedures / Treatments   Labs (all labs ordered are listed, but only abnormal results are displayed) Labs Reviewed  BASIC METABOLIC PANEL - Abnormal; Notable for the following components:      Result Value   Glucose, Bld 130 (*)    Calcium 8.8 (*)    All other components within normal limits  CBC - Abnormal; Notable for the following components:   RBC 3.55 (*)    Hemoglobin 10.6 (*)    HCT 33.8 (*)    All other components within normal limits  I-STAT BETA HCG BLOOD, ED ([facility], [facility], [facility] ONLY)  TROPONIN I (HIGH SENSITIVITY)  TROPONIN I (HIGH SENSITIVITY)    EKG EKG Interpretation  Date/Time:  Friday February 17 2021 22:50:58 EST Ventricular Rate:  74 PR Interval:  150 QRS Duration: 66 QT Interval:  358 QTC Calculation: 397 R Axis:   22 Text Interpretation: Normal sinus rhythm Normal ECG No acute changes Confirmed by Merrily Pew [id number]) on 02/18/2021 12:44:36 AM   Radiology DG Chest 2 View  Result Date: 02/17/2021 CLINICAL DATA:  Chest pain.  Left arm numbness. EXAM: CHEST - 2 VIEW COMPARISON:  07/15/2020 FINDINGS: The cardiomediastinal contours are normal. The lungs are clear. Pulmonary vasculature is normal. No consolidation, pleural effusion, or pneumothorax. Mild midthoracic spondylosis. No acute osseous abnormalities are seen. IMPRESSION: No acute chest findings.  Electronically Signed   By: Keith Rake M.D.   On: 02/17/2021 23:38    Procedures Procedures   Medications Ordered in ED Medications  metoCLOPramide (REGLAN) injection 10 mg (10 mg Intravenous Given 02/18/21 0536)  sodium chloride 0.9 % bolus 1,000 mL (1,000 mLs Intravenous New Bag/Given 02/18/21 0535)    ED Course  I have reviewed the triage vital signs and the nursing notes.  Pertinent labs & imaging results that were available during my care of the patient were reviewed by me and considered in my medical decision making (see chart for details).    MDM Rules/Calculators/A&P                         eval for intracranial complication from surgery vs PE. troponins normal. ecg ressurring. Can likely go home if workup ok.  Care transferred pending CTA.   Final Clinical Impression(s) / ED Diagnoses Final diagnoses:  None    Rx / DC Orders ED Discharge Orders    None       Eyob Godlewski, Corene Cornea, MD 02/18/21 2348

## 2021-02-18 NOTE — ED Notes (Signed)
Patient transported to CT via stretcher in stable condition 

## 2021-12-17 HISTORY — PX: WISDOM TOOTH EXTRACTION: SHX21

## 2022-05-07 ENCOUNTER — Ambulatory Visit: Payer: Medicare HMO | Admitting: Cardiology

## 2022-05-11 ENCOUNTER — Other Ambulatory Visit: Payer: Medicare HMO

## 2022-05-11 ENCOUNTER — Ambulatory Visit: Payer: Medicare HMO | Admitting: Cardiology

## 2022-05-11 ENCOUNTER — Encounter: Payer: Self-pay | Admitting: Cardiology

## 2022-05-11 VITALS — BP 123/76 | HR 66 | Temp 98.1°F | Resp 17 | Ht 66.0 in | Wt 167.8 lb

## 2022-05-11 DIAGNOSIS — R200 Anesthesia of skin: Secondary | ICD-10-CM

## 2022-05-11 DIAGNOSIS — D649 Anemia, unspecified: Secondary | ICD-10-CM

## 2022-05-11 DIAGNOSIS — R011 Cardiac murmur, unspecified: Secondary | ICD-10-CM

## 2022-05-11 DIAGNOSIS — E559 Vitamin D deficiency, unspecified: Secondary | ICD-10-CM

## 2022-05-11 NOTE — Progress Notes (Signed)
Primary Physician/Referring:  Rometta Emery, MD  Patient ID: Stefanie Wade, female    DOB: 1971/06/17, 51 y.o.   MRN: [id number]  No chief complaint on file.  HPI:    Stefanie Wade  is a 51 y.o. African-American female patient with fibromyalgia, posttraumatic stress disorder, orthostatic hypotension, I had last seen her in 2018 for atypical chest pain, now referred back for evaluation of low heart rate smart watch and left arm numbness and chest pain. She also has history of pituitary adenoma resection in 2022.  She describes chest pain as sharp pain in the middle of the chest, last a few seconds, occurs sporadically and no exertional complaints.  No other associated symptoms.  She also has occasional episodes of palpitations, mostly at rest and last a few seconds.  No other associated symptoms.  Over the past year to year and a half, she has noticed tingling and numbness in her left arm that has been persistent.  She was concerned about both chest pain, left arm pain and wanted to be evaluated further.  In 2018 she also was told to have mild leak in the mitral valve and borderline mitral valve prolapse and also wanted to follow-up on this.  Past Medical History:  Diagnosis Date   Anemia    Arthritis    Bilateral carpal tunnel syndrome    Chronic fatigue    Depression    Fibromyalgia    GERD (gastroesophageal reflux disease)    Headache    Migraines   History of kidney stones    Low blood pressure    Lower extremity numbness    Numbness and tingling of both upper extremities while sleeping    Panic attack    PONV (postoperative nausea and vomiting)    PTSD (post-traumatic stress disorder)    Shortness of breath dyspnea    with stairs   Vertigo    Past Surgical History:  Procedure Laterality Date   CARDIOVASCULAR STRESS TEST  07/08/2017   [facility]: Myocardial perfusion imaging is normal. LVEF 67%.   CESAREAN SECTION     CRANIOTOMY N/A 02/10/2021    Procedure: Transspheniodal resection of pituitary tumor;  Surgeon: Coletta Memos, MD;  Location: [facility] OR;  Service: Neurosurgery;  Laterality: N/A;   FOOT SURGERY     IUD REMOVAL  09/03/2016   Procedure: INTRAUTERINE DEVICE (IUD) REMOVAL;  Surgeon: Levi Aland, MD;  Location: [facility] ORS;  Service: Gynecology;;   LAPAROSCOPIC TUBAL LIGATION N/A 09/03/2016   Procedure: LAPAROSCOPIC TUBAL LIGATION;  Surgeon: Levi Aland, MD;  Location: [facility] ORS;  Service: Gynecology;  Laterality: N/A;  FILSHIE CLIPS   TRANSPHENOIDAL APPROACH EXPOSURE N/A 02/10/2021   Procedure: TRANSPHENOIDAL APPROACH EXPOSURE;  Surgeon: Osborn Coho, MD;  Location: [facility] OR;  Service: ENT;  Laterality: N/A;   TRANSTHORACIC ECHOCARDIOGRAM  07/18/2017   [facility]: LV cavity normal in size, mild-mod LVH, normal wall motion and diastolic filling pattern, EF 55%, borderline MVP, no significant myxomatous degeneration, mild MR, trace TR/PR   WISDOM TOOTH EXTRACTION  2023   Family History  Problem Relation Age of Onset   Heart disease Mother    Diabetes Mother    Hypertension Mother    Brain cancer Mother    Diabetes Sister    Thyroid disease Sister    Colon cancer Brother    Kidney cancer Brother    Diabetes Maternal Grandmother    Hypertension Maternal Grandmother    Diabetes Paternal Grandmother    Hypertension Paternal Grandmother  Heart attack Neg Hx    Hyperlipidemia Neg Hx    Sudden death Neg Hx     Social History   Tobacco Use   Smoking status: Never   Smokeless tobacco: Never  Substance Use Topics   Alcohol use: Yes    Comment: occ   Marital Status: Single  ROS  Review of Systems  Cardiovascular:  Positive for chest pain and palpitations. Negative for dyspnea on exertion and leg swelling.  Neurological:  Positive for paresthesias (left arm).  Objective  Blood pressure 123/76, pulse 66, temperature 98.1 F (36.7 C), temperature source Temporal, resp. rate 17, height $RemoveBe'5\' 6"'jfMSvTNPz$  (1.676 m),  weight 167 lb 12.8 oz (76.1 kg), SpO2 100 %. Body mass index is 27.08 kg/m.     05/11/2022    9:19 AM 02/18/2021    8:00 AM 02/18/2021    7:30 AM  Vitals with BMI  Height $Remov'5\' 6"'raiduR$     Weight 167 lbs 13 oz    BMI 66.5    Systolic 993 570 177  Diastolic 76 84 75  Pulse 66 83 79    Physical Exam Neck:     Vascular: No JVD.  Cardiovascular:     Rate and Rhythm: Normal rate and regular rhythm.     Pulses: Intact distal pulses.     Heart sounds: Murmur heard.  Midsystolic murmur is present with a grade of 2/6 at the upper right sternal border and apex.    No gallop.  Pulmonary:     Effort: Pulmonary effort is normal.     Breath sounds: Normal breath sounds.  Abdominal:     General: Bowel sounds are normal.     Palpations: Abdomen is soft.  Musculoskeletal:     Right lower leg: No edema.     Left lower leg: No edema.    Medications and allergies   Allergies  Allergen Reactions   Acetaminophen-Codeine Palpitations and Shortness Of Breath    Other reaction(s): Dizziness, Headache   Codeine     Becomes jittery when she takes Tylenol #3 (contains codeine)   Morphine And Related Nausea And Vomiting    Jittery      Medication list after today's encounter   Current Outpatient Medications:    ALPRAZolam (XANAX) 1 MG tablet, Take 1 mg by mouth every 6 (six) hours as needed for anxiety. , Disp: , Rfl: 0   amoxicillin (AMOXIL) 500 MG tablet, Take 1 tablet by mouth daily., Disp: , Rfl:    cetirizine (ZYRTEC) 10 MG tablet, Take 10 mg by mouth daily as needed. For allergies, Disp: , Rfl: 2   cyclobenzaprine (FLEXERIL) 5 MG tablet, Take 1 tablet by mouth daily., Disp: , Rfl:    FLUoxetine (PROZAC) 20 MG capsule, Take 20 mg by mouth 2 (two) times daily., Disp: , Rfl:    gabapentin (NEURONTIN) 300 MG capsule, Take 300 mg by mouth 3 (three) times daily., Disp: , Rfl:    hydrocortisone (CORTEF) 10 MG tablet, 1 tablet twice daily for 3 days then stop, Disp: 7 tablet, Rfl: 0   midodrine  (PROAMATINE) 10 MG tablet, Take 10 mg by mouth 3 (three) times daily as needed (for low blood pressure)., Disp: , Rfl:    Multiple Vitamins-Minerals (MULTIVITAMIN WITH MINERALS) tablet, Take 1 tablet by mouth daily., Disp: , Rfl:    Oxycodone HCl 10 MG TABS, Take 10 mg by mouth 3 (three) times daily., Disp: , Rfl: 0   vitamin B-12 (CYANOCOBALAMIN) 1000 MCG tablet, Take 1,000  mcg by mouth daily., Disp: , Rfl:    zinc gluconate 50 MG tablet, Take 50 mg by mouth daily., Disp: , Rfl:    HYDROcodone-acetaminophen (NORCO/VICODIN) 5-325 MG tablet, Take 1 tablet by mouth every 4 (four) hours as needed for moderate pain., Disp: 30 tablet, Rfl: 0  Laboratory examination:   No results for input(s): NA, K, CL, CO2, GLUCOSE, BUN, CREATININE, CALCIUM, GFRNONAA, GFRAA in the last 8760 hours. CrCl cannot be calculated (Patient's most recent lab result is older than the maximum 21 days allowed.).     Latest Ref Rng & Units 02/17/2021   11:07 PM 02/10/2021   11:02 AM 02/08/2021   12:30 PM  CMP  Glucose 70 - 99 mg/dL 130    88    BUN 6 - 20 mg/dL 6    7    Creatinine 0.44 - 1.00 mg/dL 0.95   0.85   0.82    Sodium 135 - 145 mmol/L 136    137    Potassium 3.5 - 5.1 mmol/L 3.9    3.9    Chloride 98 - 111 mmol/L 105    104    CO2 22 - 32 mmol/L 22    24    Calcium 8.9 - 10.3 mg/dL 8.8    8.8        Latest Ref Rng & Units 02/17/2021   11:07 PM 02/10/2021   11:02 AM 02/08/2021   12:30 PM  CBC  WBC 4.0 - 10.5 K/uL 6.5   4.6   3.8    Hemoglobin 12.0 - 15.0 g/dL 10.6   10.9   10.7    Hematocrit 36.0 - 46.0 % 33.8   34.8   33.0    Platelets 150 - 400 K/uL 336   279   300     External labs:   Labs 11/07/2020:  Total cholesterol 164, triglycerides 60, HDL 50, LDL 99.  Non-HDL cholesterol 114.  BUN 8, creatinine 0.94, EGFR 83 mL, potassium 4.0, LFTs normal.  A1c 5.7%.  Hb 11.6/HCT 34.9, platelet 344, normal indicis.  Radiology:    Cardiac Studies:   Treadmill stress test   [07/01/2017]:  07/01/2017: Indication: CP, SoB Resting EKG demonstrates NSR. The patient exercised according to Bruce Protocol, Total time recorded 2:09 min achieving max heart rate of 118 which was 67% of THR for age and 4.64 METS of work. Stress terminated due to dyspnea. Normal BP response. There was no ST-T changes of ischemia with exercise stress test. There were no significant arrhythmias. Rec: No e/o ischemia by GXT at submaximal exercise. Exercise tolerence is markedly reduced and patient requested stopping the test due to chest pain . Will set up for pharmacologic stress  Nuclear stress test   [07/08/2017]:  1. The resting electrocardiogram demonstrated normal sinus rhythm, normal resting conduction, no resting arrhythmias and normal rest repolarization. Stress EKG is non-diagnostic for ischemia as it a pharmacologic stress using Lexiscan. 2. Myocardial perfusion imaging is normal. Overall left ventricular systolic function was normal without regional wall motion abnormalities. The left ventricular ejection fraction was 67%.  Echocardiogram   [07/18/2017]:  Left ventricle cavity is normal in size. Mild to moderate concentric hypertrophy of the left ventricle. Normal global wall motion. Normal diastolic filling pattern. Calculated EF 55%. Borderline mitral valve prolapse. No significant myxomatous degeneration. Mild (Grade I) mitral regurgitation. Trace tricuspid regurgitation. Trace pulmonic regurgitation. Compared to the study done on 01/15/2011, no significant change. Previously trace mitral regurgitation. Borderline MVP new  Echocardiogram  05/11/2022:  Normal LV systolic function with EF 63%. Left ventricle cavity is normal in size. Normal left ventricular wall thickness. Normal global wall motion. Calculated EF 63%. No significant valvular abnormalities. Normal echocardiogram.  Compared to 07/18/2017, borderline MVP not present.   EKG:   EKG 05/11/2022: Normal sinus rhythm at rate of 62 bpm,  normal EKG.    Assessment     ICD-10-CM   1. Murmur  R01.1 EKG 12-Lead    PCV ECHOCARDIOGRAM COMPLETE    2. Left arm numbness  R20.0     3. Chronic anemia  D64.9     4. Hypovitaminosis D  E55.9        Medications Discontinued During This Encounter  Medication Reason   cephALEXin (KEFLEX) 500 MG capsule Completed Course   cyclobenzaprine (FLEXERIL) 10 MG tablet Completed Course   Cholecalciferol (VITAMIN D) 50 MCG (2000 UT) tablet     No orders of the defined types were placed in this encounter.  Orders Placed This Encounter  Procedures   EKG 12-Lead   PCV ECHOCARDIOGRAM COMPLETE    Standing Status:   Future    Number of Occurrences:   1    Standing Expiration Date:   05/12/2023   Recommendations:   Stefanie Wade is a 51 y.o. African-American female patient with fibromyalgia, posttraumatic stress disorder, orthostatic hypotension, I had last seen her in 2018 for atypical chest pain, now referred back for evaluation of low heart rate smart watch and left arm numbness and chest pain. She also has history of pituitary adenoma resection in 2022.  Her chest pain symptoms are clearly musculoskeletal lasting a few seconds and worse when taking deep breath.  She also has occasional episodes of palpitations again suggesting PACs and PVCs.  Left arm numbness and paresthesia is persistent over the past year and a half or so, suspect it could be a neurologic issue with it as cervical spine disease could be evaluated further if needed, does not suggest TIA or cardiac etiology.  She does have a systolic ejection murmur, previous echocardiogram in 2018 and revealed borderline mitral valve prolapse with mild mitral regurgitation.  We will repeat echocardiogram to reevaluate this and also to exclude any pericardial issues in view of pleuritic type chest pain.  Do not suspect pulmonary embolism.  With regard to resting heart rate of 60 bpm, it is probably nonspecific, I reassured her.  She  has been exercising regularly by walking briskly almost on a daily basis and is also active at work.  She has chronic anemia, dysmenorrhea work-up.  She was also told to have severe vitamin D deficiency, presently she is not on vitamin D supplements, this need to be followed up as well.  Addendum: I performed echo today.   Echocardiogram reviewed with the patient, no significant valvular abnormality, essentially normal echocardiogram.  I reassured her, no further evaluation is indicated.  I suspect the systolic murmur is a flow murmur due to her anemia.   Stefanie Prows, MD, [facility] 05/11/2022, 10:19 AM Office: [phone]

## 2022-10-30 ENCOUNTER — Other Ambulatory Visit: Payer: Self-pay | Admitting: Internal Medicine

## 2022-10-30 DIAGNOSIS — Z1231 Encounter for screening mammogram for malignant neoplasm of breast: Secondary | ICD-10-CM

## 2023-01-03 ENCOUNTER — Other Ambulatory Visit: Payer: Self-pay | Admitting: Internal Medicine

## 2023-01-03 DIAGNOSIS — N644 Mastodynia: Secondary | ICD-10-CM

## 2023-02-11 ENCOUNTER — Ambulatory Visit
Admission: RE | Admit: 2023-02-11 | Discharge: 2023-02-11 | Disposition: A | Discharge status: Home / Self Care | Payer: Medicare HMO | Source: Ambulatory Visit | Attending: Internal Medicine | Admitting: Internal Medicine

## 2023-02-11 DIAGNOSIS — N644 Mastodynia: Secondary | ICD-10-CM

## 2024-10-22 ENCOUNTER — Encounter: Payer: Self-pay | Admitting: Internal Medicine
